# Patient Record
Sex: Female | Born: 1970 | Race: White | Hispanic: No | Marital: Married | Smoking: Never smoker
Health system: Southern US, Community
[De-identification: ages and names within clinical notes are randomized; demographics above are authoritative.]

## PROBLEM LIST (undated history)

## (undated) DIAGNOSIS — K625 Hemorrhage of anus and rectum: Secondary | ICD-10-CM

## (undated) DIAGNOSIS — D219 Benign neoplasm of connective and other soft tissue, unspecified: Secondary | ICD-10-CM

## (undated) DIAGNOSIS — T7840XA Allergy, unspecified, initial encounter: Secondary | ICD-10-CM

## (undated) DIAGNOSIS — C801 Malignant (primary) neoplasm, unspecified: Secondary | ICD-10-CM

## (undated) HISTORY — PX: DILATION AND CURETTAGE OF UTERUS: SHX78

## (undated) HISTORY — PX: DENTAL SURGERY: SHX609

## (undated) HISTORY — DX: Hemorrhage of anus and rectum: K62.5

## (undated) HISTORY — DX: Benign neoplasm of connective and other soft tissue, unspecified: D21.9

## (undated) HISTORY — PX: COLONOSCOPY: SHX174

## (undated) HISTORY — DX: Allergy, unspecified, initial encounter: T78.40XA

## (undated) HISTORY — DX: Malignant (primary) neoplasm, unspecified: C80.1

---

## 2006-09-22 ENCOUNTER — Ambulatory Visit: Payer: Self-pay | Admitting: Internal Medicine

## 2006-09-22 LAB — CONVERTED CEMR LAB
ALT: 19 units/L (ref 0–40)
AST: 23 units/L (ref 0–37)
Albumin: 4.1 g/dL (ref 3.5–5.2)
Alkaline Phosphatase: 43 units/L (ref 39–117)
BUN: 13 mg/dL (ref 6–23)
Basophils Absolute: 0 10*3/uL (ref 0.0–0.1)
Basophils Relative: 0.8 % (ref 0.0–1.0)
Bilirubin, Direct: 0.1 mg/dL (ref 0.0–0.3)
CO2: 26 meq/L (ref 19–32)
Calcium: 9.6 mg/dL (ref 8.4–10.5)
Chloride: 106 meq/L (ref 96–112)
Cholesterol: 176 mg/dL (ref 0–200)
Creatinine, Ser: 0.9 mg/dL (ref 0.4–1.2)
Eosinophils Absolute: 0.2 10*3/uL (ref 0.0–0.6)
Eosinophils Relative: 4.1 % (ref 0.0–5.0)
GFR calc Af Amer: 92 mL/min
GFR calc non Af Amer: 76 mL/min
Glucose, Bld: 94 mg/dL (ref 70–99)
HCT: 40.1 % (ref 36.0–46.0)
HDL: 54.3 mg/dL (ref >39.0)
Hemoglobin: 13.9 g/dL (ref 12.0–15.0)
LDL Cholesterol: 107 mg/dL — ABNORMAL HIGH (ref 0–99)
Lymphocytes Relative: 49.7 % — ABNORMAL HIGH (ref 12.0–46.0)
MCHC: 34.8 g/dL (ref 30.0–36.0)
MCV: 91.6 fL (ref 78.0–100.0)
Monocytes Absolute: 0.4 10*3/uL (ref 0.2–0.7)
Monocytes Relative: 9.3 % (ref 3.0–11.0)
Neutro Abs: 1.7 10*3/uL (ref 1.4–7.7)
Neutrophils Relative %: 36.1 % — ABNORMAL LOW (ref 43.0–77.0)
Platelets: 164 10*3/uL (ref 150–400)
Potassium: 4.4 meq/L (ref 3.5–5.1)
RBC: 4.38 M/uL (ref 3.87–5.11)
RDW: 12.4 % (ref 11.5–14.6)
Sodium: 142 meq/L (ref 135–145)
TSH: 6.17 microintl units/mL — ABNORMAL HIGH (ref 0.35–5.50)
Total Bilirubin: 1 mg/dL (ref 0.3–1.2)
Total CHOL/HDL Ratio: 3.2
Total Protein: 6.9 g/dL (ref 6.0–8.3)
Triglycerides: 72 mg/dL (ref 0–149)
VLDL: 14 mg/dL (ref 0–40)
WBC: 4.6 10*3/uL (ref 4.5–10.5)

## 2006-09-29 ENCOUNTER — Ambulatory Visit: Payer: Self-pay | Admitting: Internal Medicine

## 2006-10-09 ENCOUNTER — Encounter: Admission: RE | Admit: 2006-10-09 | Discharge: 2006-10-09 | Payer: Self-pay | Admitting: Internal Medicine

## 2006-10-25 ENCOUNTER — Encounter: Admission: RE | Admit: 2006-10-25 | Discharge: 2006-10-25 | Payer: Self-pay | Admitting: Internal Medicine

## 2006-12-22 ENCOUNTER — Ambulatory Visit: Payer: Self-pay | Admitting: Internal Medicine

## 2006-12-22 LAB — CONVERTED CEMR LAB: TSH: 3.63 microintl units/mL (ref 0.35–5.50)

## 2006-12-29 ENCOUNTER — Ambulatory Visit: Payer: Self-pay | Admitting: Internal Medicine

## 2007-03-29 DIAGNOSIS — E348 Other specified endocrine disorders: Secondary | ICD-10-CM | POA: Insufficient documentation

## 2007-07-05 ENCOUNTER — Ambulatory Visit: Payer: Self-pay | Admitting: Internal Medicine

## 2007-07-05 DIAGNOSIS — M25569 Pain in unspecified knee: Secondary | ICD-10-CM | POA: Insufficient documentation

## 2007-07-05 DIAGNOSIS — M25519 Pain in unspecified shoulder: Secondary | ICD-10-CM | POA: Insufficient documentation

## 2007-07-18 ENCOUNTER — Telehealth (INDEPENDENT_AMBULATORY_CARE_PROVIDER_SITE_OTHER): Payer: Self-pay | Admitting: *Deleted

## 2007-07-22 ENCOUNTER — Encounter: Admission: RE | Admit: 2007-07-22 | Discharge: 2007-07-22 | Payer: Self-pay | Admitting: Internal Medicine

## 2007-07-25 ENCOUNTER — Telehealth: Payer: Self-pay | Admitting: Internal Medicine

## 2007-10-24 ENCOUNTER — Ambulatory Visit: Payer: Self-pay | Admitting: Internal Medicine

## 2007-10-24 DIAGNOSIS — R634 Abnormal weight loss: Secondary | ICD-10-CM | POA: Insufficient documentation

## 2007-10-31 LAB — CONVERTED CEMR LAB: Tissue Transglutaminase Ab, IgA: 0.6 units (ref <7)

## 2007-11-15 ENCOUNTER — Ambulatory Visit: Payer: Self-pay | Admitting: Gastroenterology

## 2008-01-10 ENCOUNTER — Ambulatory Visit: Payer: Self-pay | Admitting: Internal Medicine

## 2008-01-10 ENCOUNTER — Telehealth: Payer: Self-pay | Admitting: Internal Medicine

## 2008-01-11 ENCOUNTER — Telehealth: Payer: Self-pay | Admitting: Internal Medicine

## 2008-01-24 ENCOUNTER — Telehealth: Payer: Self-pay | Admitting: Internal Medicine

## 2008-01-25 ENCOUNTER — Ambulatory Visit: Payer: Self-pay | Admitting: Internal Medicine

## 2008-01-25 DIAGNOSIS — R0609 Other forms of dyspnea: Secondary | ICD-10-CM

## 2008-01-25 DIAGNOSIS — R0989 Other specified symptoms and signs involving the circulatory and respiratory systems: Secondary | ICD-10-CM | POA: Insufficient documentation

## 2008-02-12 ENCOUNTER — Encounter: Payer: Self-pay | Admitting: Internal Medicine

## 2008-02-12 ENCOUNTER — Ambulatory Visit: Payer: Self-pay | Admitting: Internal Medicine

## 2008-02-18 ENCOUNTER — Telehealth (INDEPENDENT_AMBULATORY_CARE_PROVIDER_SITE_OTHER): Payer: Self-pay | Admitting: *Deleted

## 2008-04-11 ENCOUNTER — Encounter: Payer: Self-pay | Admitting: Gastroenterology

## 2008-04-14 ENCOUNTER — Telehealth: Payer: Self-pay | Admitting: Gastroenterology

## 2008-04-23 ENCOUNTER — Ambulatory Visit: Payer: Self-pay | Admitting: Gastroenterology

## 2008-04-30 ENCOUNTER — Ambulatory Visit: Payer: Self-pay | Admitting: Gastroenterology

## 2008-04-30 ENCOUNTER — Encounter: Payer: Self-pay | Admitting: Gastroenterology

## 2008-05-01 ENCOUNTER — Telehealth: Payer: Self-pay | Admitting: Gastroenterology

## 2008-05-02 ENCOUNTER — Encounter: Payer: Self-pay | Admitting: Gastroenterology

## 2010-01-10 IMAGING — CT CT ABD-PELV W/O CM
3 of 8 series · 13 of 42 positions shown, 19 images · IV contrast (CONTRAST)
Comparison: NONE

CLINICAL DATA: Unexplained weight loss. 

CT OF THE ABDOMEN AND PELVIS WITHOUT AND WITH INTRAVENOUS AND 
FOLLOWING  ORAL CONTRAST
TECHNIQUE: Multiple axial 5-millimeter thick slices at 
5-millimeter intervals were obtained from the lung base through 
the pelvis following the intravenous administration of 100 cc of 
Optiray 350 at a rate of 3 cc per second. Oral contrast was 
administered as well.  Arterial and venous phase imaging was 
obtained in the upper abdomen with delayed images obtained through 
the pelvis.

[Series 4: venous · axial · portal-venous · 0.65mm/px · z∈[+644,+909]mm · 5 of 81 slices shown, 10 images]
[im 14/81  soft-tissue]
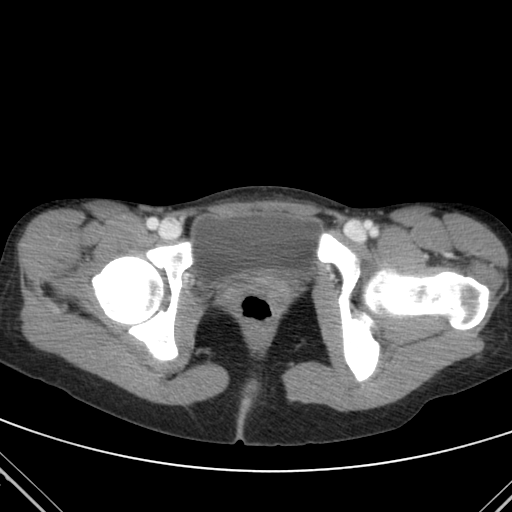
[im 14/81  bone]
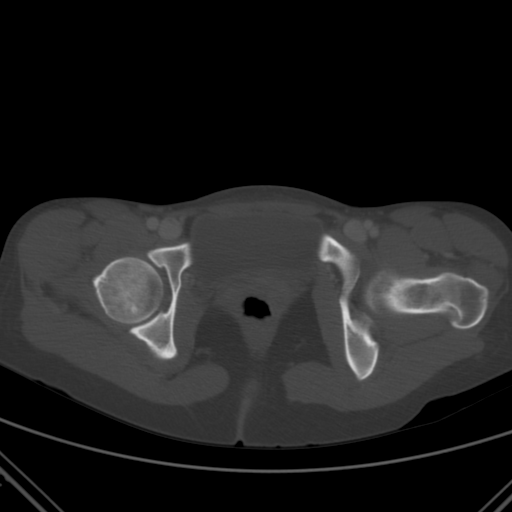
[im 27/81  soft-tissue]
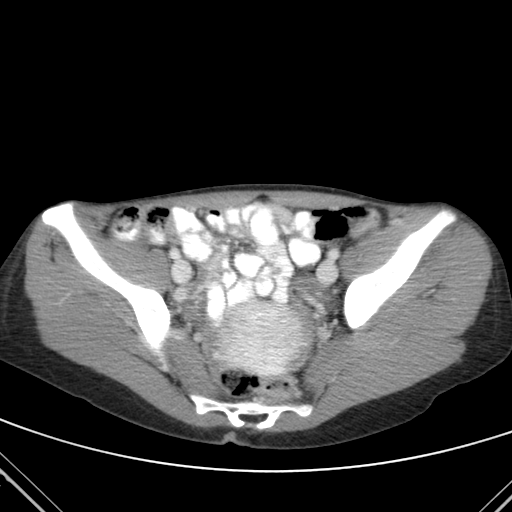
[im 27/81  lung]
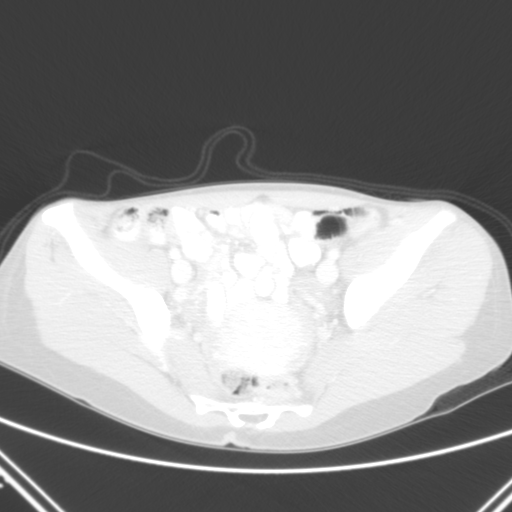
[im 41/81  soft-tissue]
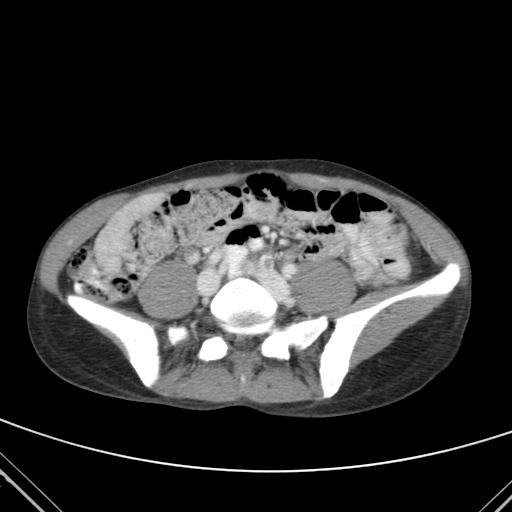
[im 41/81  lung]
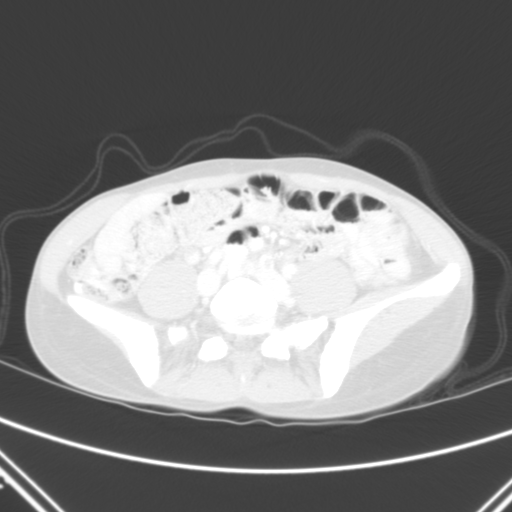
[im 54/81  soft-tissue]
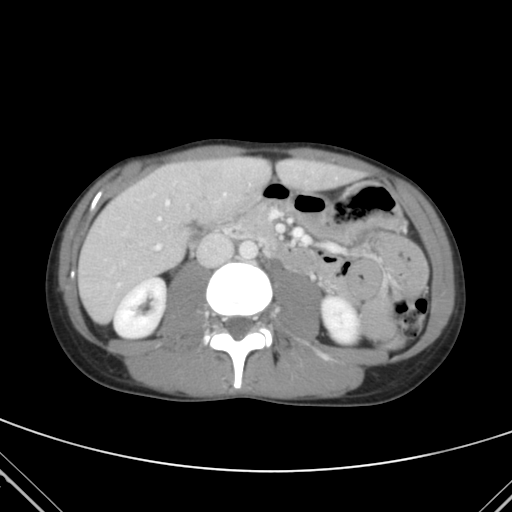
[im 54/81  lung]
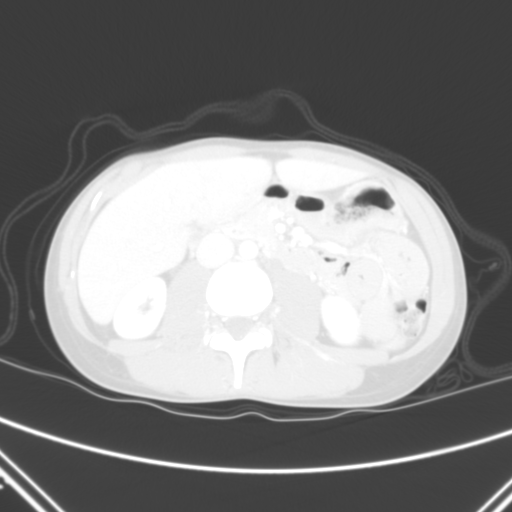
[im 67/81  soft-tissue]
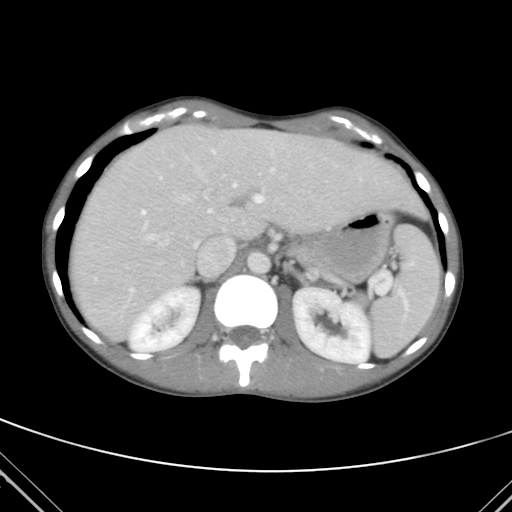
[im 67/81  lung]
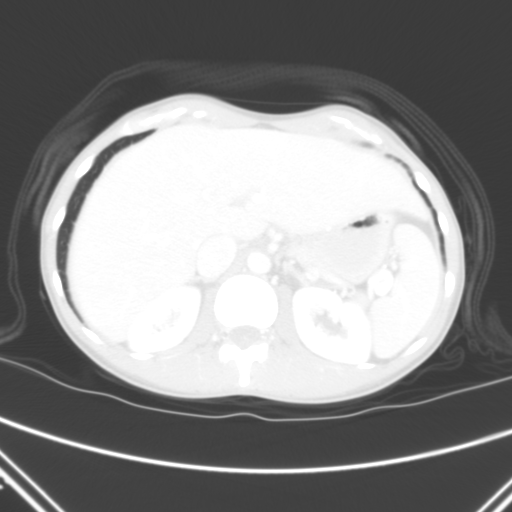

[Series 9: delays · axial · 0.65mm/px · z∈[+639,+909]mm · 5 of 82 slices shown]
[im 14/82  soft-tissue]
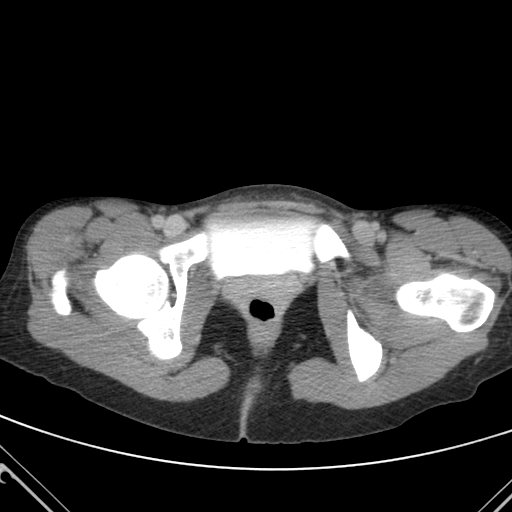
[im 28/82  soft-tissue]
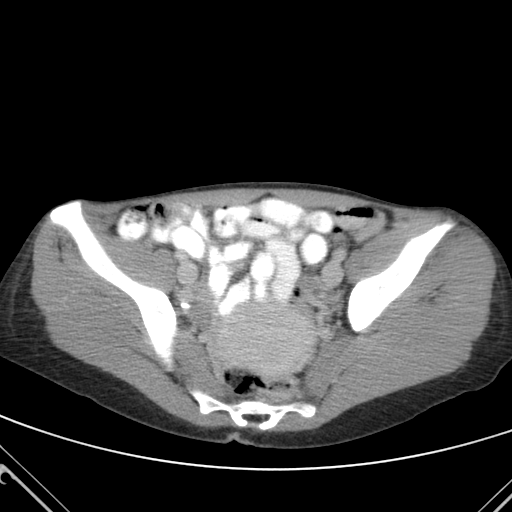
[im 41/82  soft-tissue]
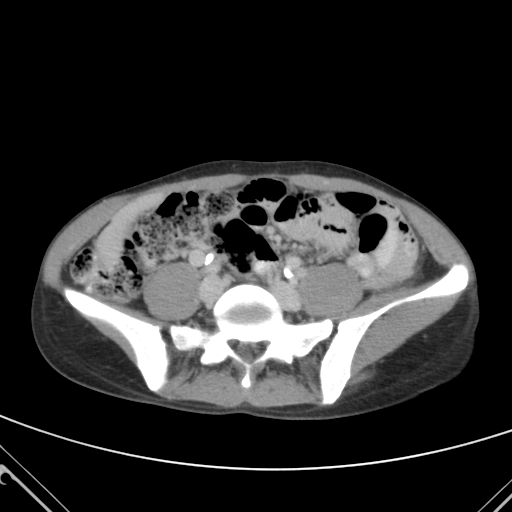
[im 55/82  soft-tissue]
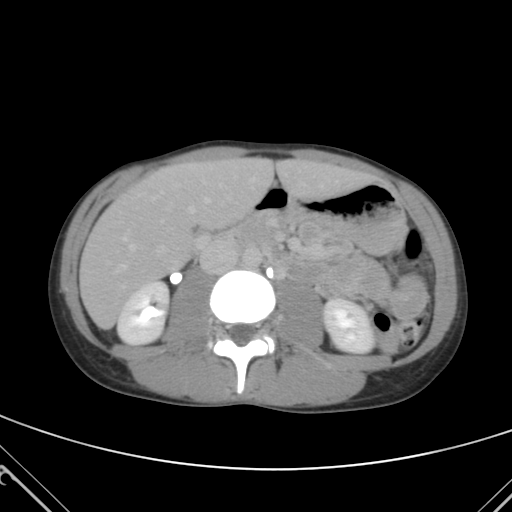
[im 68/82  soft-tissue]
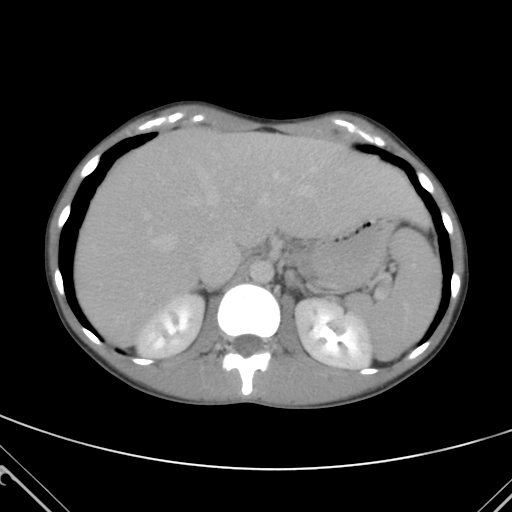

[coronals · coronal · 0.78mm/px · 3 of 59 slices shown, 4 images]
[im 20/59  soft-tissue]
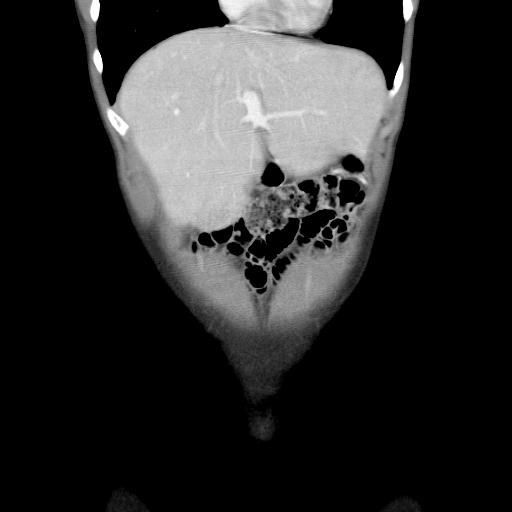
[im 26/59  soft-tissue]
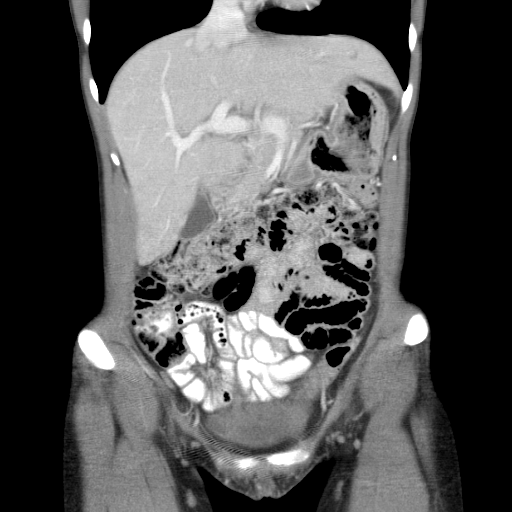
[im 26/59  bone]
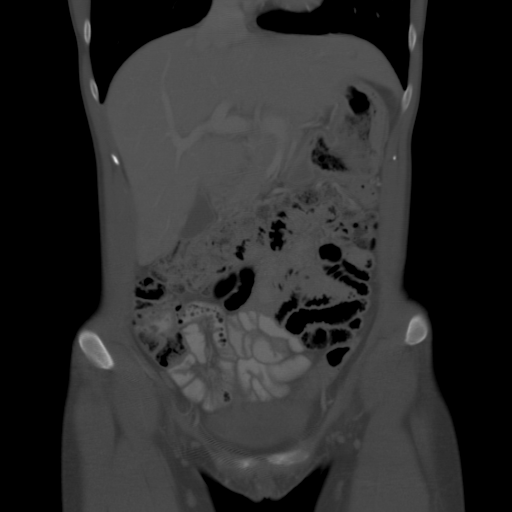
[im 33/59  soft-tissue]
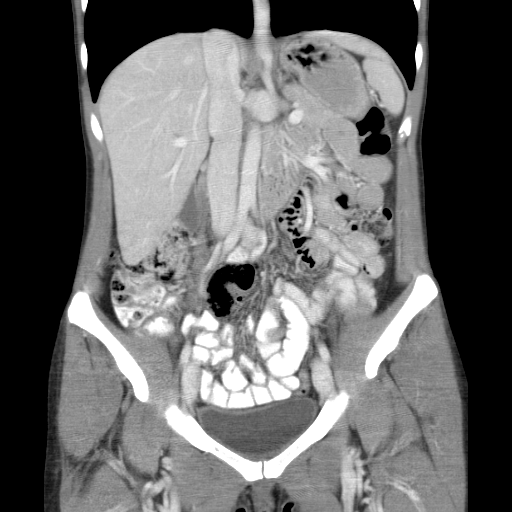

[13 of 42 positions shown; findings below may reference images not displayed]

FINDINGS: No renal calculi are identified. The liver, gallbladder, 
and spleen are unremarkable.  No focal or diffuse enlargement is 
noted of the pancreas.  There is no evidence of upper abdominal or 
retroperitoneal mass, adenopathy, or aneurysm. No adrenal or renal 
masses are noted.  There is no evidence of uropathy. No bowel, 
mesenteric, adenopathy or inflammatory process. No lung base mass, 
infiltrate, edema, or effusion. No lytic or blastic lesions are 
identified.
IMPRESSION: Negative CT of the abdomen. Ravi, Alexei 
electronically reviewed on 04/18/2008 Dict Date: 04/18/2008  Tran 
Date:  04/18/2008 DAS  [REDACTED]

## 2010-09-05 ENCOUNTER — Encounter: Payer: Self-pay | Admitting: Family Medicine

## 2010-12-28 NOTE — Assessment & Plan Note (Signed)
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 Cato Mulligan for evaluation and possible  celiac disease.   Stacy Roberts has had 6 months of gas, bloating, floating stools,  frequent  stools, but no rectal bleeding, anorexia or weight loss.  She denies  abdominal pain.  She has had no major changes in her diet, foreign  travel, or known infectious disease exposure.  Over the last several  weeks her bowels have returned to normal.  She does have periodic bouts  with constipation.  She has had no upper gastrointestinal,  hepatobiliary, or general medical symptoms otherwise.  Specifically, she  denies skin rashes, joint pains, oral stomatitis, visual difficulties.  She does have a chronic history, however, of recurrent unexplained iron  deficiency and mild osteopenia.  She generally feels well, denies fever,  chills, or other systemic complaints.  She has never had endoscopic  exams of her intestines and has not had history of familial  gastrointestinal problems as a child.   PAST MEDICAL HISTORY:  The patient has had a D and C for miscarriage and  has had previous TMJ surgery.   MEDICATIONS:  None.   ALLERGIES:  NONE.   FAMILY HISTORY:  Remarkable for Hashimoto's disease with thyroid cancer  and surgery in her mother.  She also has a paternal grandmother that had  colon cancer at an elderly age.   SOCIAL HISTORY:  She is married, lives with her husband and family and  has a bachelor's degree.  She does not use smoke or use ethanol.   REVIEW OF SYSTEMS:  Noncontributory with last menstrual period 2 weeks  ago.   LABORATORY DATA:  Recent lab data per Dr. Cato Mulligan showed a negative  tissue  transglutaminase and a weakly positive IgG antigliadin antibody  with a normal IgA antigliadin antibody.  Lab data otherwise included a  CBC, a metabolic profile was normal, except for slightly elevated TSH of  6.17.  The patient has had recent MRI of her head and has a small  neopaneal cyst.  Also reviewed on her chart, the patient does take  calcium and vitamin D replacement therapy.   PHYSICAL EXAMINATION:  She is a healthy-appearing white female,  appearing her stated age in no acute distress.  I cannot appreciate  stigmata of chronic liver disease.  She weighs 115 pounds.  Her blood  pressure is 90/64 and pulse was 72 and regular.  There was no  thyromegaly or lymphadenopathy noted.  Her chest was entirely clear and  she was in a regular rhythm without murmurs, gallops or rubs.  ABDOMEN:  Showed no organomegaly, masses, or tenderness.  Bowel sounds  were normal.  EXTREMITIES:  Unremarkable, without edema, phlebitis, swollen joints, or  any rashes.  MENTAL STATUS:  Clear.   ASSESSMENT:  Faige may have gluten  sensitivity, but I doubt she has  genetic celiac disease with a negative tissue transglutaminase.  I  really cannot explain her symptomatology otherwise, except she most  likely has irritable bowel syndrome.   RECOMMENDATIONS:  I have gone ahead and scheduled Stacy Roberts for  endoscopy and small bowel biopsy for definitive diagnosis to exclude  celiac disease.  Her gastrointestinal followup treatment will depend on  these results.  I have asked her to followup otherwise with Dr. Cato Mulligan  as previously planned.     Vania Rea. Jarold Motto, MD, Caleen Essex, FAGA  Electronically Signed    DRP/MedQ  DD: 11/15/2007  DT: 11/15/2007  Job #: [id number]   cc:   Valetta Mole. Swords, MD

## 2011-04-11 ENCOUNTER — Other Ambulatory Visit: Payer: Self-pay | Admitting: Dermatology

## 2012-02-07 ENCOUNTER — Other Ambulatory Visit: Payer: Self-pay | Admitting: Obstetrics and Gynecology

## 2012-02-07 DIAGNOSIS — N63 Unspecified lump in unspecified breast: Secondary | ICD-10-CM

## 2012-02-14 ENCOUNTER — Ambulatory Visit
Admission: RE | Admit: 2012-02-14 | Discharge: 2012-02-14 | Disposition: A | Discharge status: Home / Self Care | Payer: 59 | Source: Ambulatory Visit | Attending: Obstetrics and Gynecology | Admitting: Obstetrics and Gynecology

## 2012-02-14 DIAGNOSIS — N63 Unspecified lump in unspecified breast: Secondary | ICD-10-CM

## 2013-05-22 ENCOUNTER — Other Ambulatory Visit: Payer: Self-pay | Admitting: Obstetrics and Gynecology

## 2013-05-22 DIAGNOSIS — N6002 Solitary cyst of left breast: Secondary | ICD-10-CM

## 2013-06-03 ENCOUNTER — Ambulatory Visit
Admission: RE | Admit: 2013-06-03 | Discharge: 2013-06-03 | Disposition: A | Discharge status: Home / Self Care | Payer: 59 | Source: Ambulatory Visit | Attending: Obstetrics and Gynecology | Admitting: Obstetrics and Gynecology

## 2013-06-03 DIAGNOSIS — N6002 Solitary cyst of left breast: Secondary | ICD-10-CM

## 2013-07-16 ENCOUNTER — Encounter: Payer: Self-pay | Admitting: Family Medicine

## 2013-07-16 ENCOUNTER — Ambulatory Visit (INDEPENDENT_AMBULATORY_CARE_PROVIDER_SITE_OTHER): Payer: 59 | Admitting: Family Medicine

## 2013-07-16 VITALS — BP 114/79 | HR 60 | Ht 66.0 in | Wt 110.0 lb

## 2013-07-16 DIAGNOSIS — M25579 Pain in unspecified ankle and joints of unspecified foot: Secondary | ICD-10-CM

## 2013-07-16 DIAGNOSIS — M25571 Pain in right ankle and joints of right foot: Secondary | ICD-10-CM

## 2013-07-16 NOTE — Patient Instructions (Signed)
You have peroneal tendinitis. I'd recommend resting from running for 7-10 days. Ok to cross train with swimming, cycling, elliptical if pain is only 1-2 on a scale of 1-10. Icing 15 minutes at a time 3-4 times a day. Aleve 2 tabs twice a day with food OR ibuprofen 600mg  three times a day with food for pain and inflammation for next 7-10 days then as needed. Do home exercises (calf raises, external rotation) 3 sets of 10 once a day for next 6 weeks. Arch supports (scaphoid pads or dr. Jari Sportsman active series inserts) in running shoes for sure - consider in other shoes also. When returning to running start with walk: jog 1 minute : 1 minute for total of 10 minutes.  Every other day increase jog time by a minute, total time by 5 minutes (so 1:2 walk: jog for 15 minutes; 1:3 for 20 minutes, etc) until you get back onto your program. Cross train on off days Follow up in 1 month.

## 2013-07-17 ENCOUNTER — Encounter: Payer: Self-pay | Admitting: Family Medicine

## 2013-07-17 DIAGNOSIS — M25571 Pain in right ankle and joints of right foot: Secondary | ICD-10-CM | POA: Insufficient documentation

## 2013-07-17 NOTE — Assessment & Plan Note (Signed)
consistent with peroneal tendinopathy.  Brief ultrasound does not show abnormalities of the cuboid to suggest stress fracture.  She does have target sign of peroneus brevis though and exam fits with tendinopathy here.  Recommended rest from running for 7-10 days.  Icing, nsaids, arch support and home exercise program which was reviewed today.  Discussed return to running program.  F/u in 1 month.

## 2013-07-17 NOTE — Progress Notes (Signed)
Patient ID: Stacy Roberts, female   DOB: 03-30-1971, 42 y.o.   MRN: [id number]  PCP: Judie Petit, MD  Subjective:   HPI: Patient is a 42 y.o. female here for right foot pain.  Patient reports she is doing a couch to 10K program About 1 week ago running 5.5-6 miles outside of right foot started hurting. Ran again on Thursday and couldn't walk afterwards. Pain lateral ankle with swelling. Has been doing compression, icing, taking advil. Not run since then. Has improved to 2/10 level of pain now. No prior h/o stress fracture.  History reviewed. No pertinent past medical history.  No current outpatient prescriptions on file prior to visit.   No current facility-administered medications on file prior to visit.    History reviewed. No pertinent past surgical history.  No Known Allergies  History   Social History  . Marital Status: Married    Spouse Name: N/A    Number of Children: N/A  . Years of Education: N/A   Occupational History  . Not on file.   Social History Main Topics  . Smoking status: Never Smoker   . Smokeless tobacco: Not on file  . Alcohol Use: Not on file  . Drug Use: Not on file  . Sexual Activity: Not on file   Other Topics Concern  . Not on file   Social History Narrative  . No narrative on file    Family History  Problem Relation Age of Onset  . Sudden death Neg Hx   . Hypertension Neg Hx   . Hyperlipidemia Neg Hx   . Heart attack Neg Hx   . Diabetes Neg Hx     BP 114/79  Pulse 60  Ht 5\' 6"  (1.676 m)  Wt 110 lb (49.896 kg)  BMI 17.76 kg/m2  Review of Systems: See HPI above.    Objective:  Physical Exam:  Gen: NAD  R foot/ankle: Mild lateral ankle swelling.  No bruising, other deformity. FROM with pain on lateral rotation, calf raise. TTP peroneal tendons, less over cuboid.  No other TTP foot/ankle. Negative ant drawer and talar tilt.   Negative syndesmotic compression. Thompsons test negative. NV intact distally.    Assessment & Plan:  1. Right ankle pain - consistent with peroneal tendinopathy.  Brief ultrasound does not show abnormalities of the cuboid to suggest stress fracture.  She does have target sign of peroneus brevis though and exam fits with tendinopathy here.  Recommended rest from running for 7-10 days.  Icing, nsaids, arch support and home exercise program which was reviewed today.  Discussed return to running program.  F/u in 1 month.

## 2013-11-28 ENCOUNTER — Other Ambulatory Visit: Payer: Self-pay | Admitting: Obstetrics and Gynecology

## 2013-11-28 DIAGNOSIS — N63 Unspecified lump in unspecified breast: Secondary | ICD-10-CM

## 2013-12-09 ENCOUNTER — Ambulatory Visit
Admission: RE | Admit: 2013-12-09 | Discharge: 2013-12-09 | Disposition: A | Discharge status: Home / Self Care | Payer: 59 | Source: Ambulatory Visit | Attending: Obstetrics and Gynecology | Admitting: Obstetrics and Gynecology

## 2013-12-09 DIAGNOSIS — N63 Unspecified lump in unspecified breast: Secondary | ICD-10-CM

## 2014-04-05 ENCOUNTER — Encounter: Payer: Self-pay | Admitting: Gastroenterology

## 2014-04-14 ENCOUNTER — Other Ambulatory Visit: Payer: Self-pay | Admitting: Obstetrics and Gynecology

## 2014-04-14 DIAGNOSIS — N63 Unspecified lump in unspecified breast: Secondary | ICD-10-CM

## 2014-04-22 ENCOUNTER — Encounter (INDEPENDENT_AMBULATORY_CARE_PROVIDER_SITE_OTHER): Payer: Self-pay

## 2014-04-22 ENCOUNTER — Other Ambulatory Visit: Payer: 59

## 2014-04-22 ENCOUNTER — Ambulatory Visit
Admission: RE | Admit: 2014-04-22 | Discharge: 2014-04-22 | Disposition: A | Discharge status: Home / Self Care | Payer: 59 | Source: Ambulatory Visit | Attending: Obstetrics and Gynecology | Admitting: Obstetrics and Gynecology

## 2014-04-22 DIAGNOSIS — N63 Unspecified lump in unspecified breast: Secondary | ICD-10-CM

## 2015-06-18 ENCOUNTER — Ambulatory Visit (INDEPENDENT_AMBULATORY_CARE_PROVIDER_SITE_OTHER): Payer: 59 | Admitting: Sports Medicine

## 2015-06-18 ENCOUNTER — Encounter: Payer: Self-pay | Admitting: Sports Medicine

## 2015-06-18 VITALS — BP 115/75 | Ht 67.0 in | Wt 112.0 lb

## 2015-06-18 DIAGNOSIS — S73191A Other sprain of right hip, initial encounter: Secondary | ICD-10-CM | POA: Diagnosis not present

## 2015-06-18 DIAGNOSIS — M67879 Other specified disorders of synovium and tendon, unspecified ankle and foot: Secondary | ICD-10-CM

## 2015-06-18 DIAGNOSIS — S76019A Strain of muscle, fascia and tendon of unspecified hip, initial encounter: Secondary | ICD-10-CM | POA: Insufficient documentation

## 2015-06-18 DIAGNOSIS — R269 Unspecified abnormalities of gait and mobility: Secondary | ICD-10-CM | POA: Diagnosis not present

## 2015-06-18 DIAGNOSIS — M7660 Achilles tendinitis, unspecified leg: Secondary | ICD-10-CM | POA: Insufficient documentation

## 2015-06-18 NOTE — Assessment & Plan Note (Signed)
She needs temporary orthotic correction and possibly conversion to custom orthotics  See office visit

## 2015-06-18 NOTE — Progress Notes (Signed)
Subjective:    Patient ID: Stacy Roberts, female    DOB: 1971-02-12, 44 y.o.   MRN: [id number]  HPI  Shamira Toutant is a 44 year old female with an unremarkable past medical history presenting as a new patient to sports medicine for bilateral heel pain and RIGHT anterior hip pain.  She previously had no history of running but reports that in November 2014 she decided to start running. Originally wearing minimalist shoes, followed by 0 drop shoes, the patient completed 2 rounds of the couch to 5K program. After approximately 6 weeks (first time through the program) she developed bilateral Achilles pain which has been there since that time. She has progressively built up to 20 miles/week since completing her second round of the couch to 5K program. Her ultimate goal is to run a half marathon with a close friend in March 2017.  She reports that her bilateral Achilles pain typically comes on within a half mile or quarter mile of running. It will persist throughout the remainder of her run and she made the appointment today because now it is starting to persist after running and occasionally in the morning with her first few steps. She denies any anterior/plantar heel pain. She denies any other pain in the foot or ankle.   The only self treatment she has tried is purchasing an over-the-counter arch support/heel lift. She has not tried any medication.  Regarding her hip, starting 3 weeks ago she developed a cramping pain/sharp pain with running in the anterior/lateral hip. This is most severe with moving from a seated to standing position, running hills or stairs and after long runs/workouts.  Past medical history, past surgical history, medications, allergies and social history: -All reviewed and updated in EMR   Review of Systems A 10 point review of systems was negative other than documented above     Objective:   Physical Exam Gen: The patient is resting comfortably and in no acute  distress BP 115/75 mmHg  Ht 5\' 7"  (1.702 m)  Wt 112 lb (50.803 kg)  BMI 17.54 kg/m2  Pulmonary: Nonlabored respirations;  Psych: No abnormal thought content; appropriate mood/affect  MUSCULOSKELETAL  Lower extremity evaluation: -No abnormalities on inspection; with supine examination the patient has a 1 cm longer RIGHT leg that does not correct with a transition to a seated position or after resetting the pelvis -She has a mild genu varus angulation bilaterally -4/5 strength in bilateral hip abductor is an adductor's, slightly weaker on the RIGHT -5/5 strength in hip and knee flexion and extension -5/5 strength in hip external rotation/internal rotation  -Negative rock test; negative SI compression test. The pelvis is highly mobile. -Nontender to palpation of the AIIS, ASIS, SI joints and iliac crest bilaterally  FOOT exam -Weightbearing foot exam reveals a long, thin foot -There is some partial/metatarsal prominence/bulging in the bilateral forefeet -There is mild collapse of the bilateral transverse arches with preservation longitudinal arches -Mild valgus angulation of the bilateral Achilles tendons; symmetric diameter and appearance.  BILATERAL ACHILLES TENDON MUSCULOSKELETAL SONOGRAPHY: -The patient demonstrated bilateral retrocalcaneal bursitis, likely evidence of chronic Achilles strain/ compression -This results in increased Doppler flow -The patient had relatively small Achilles tendons bilaterally; demonstrating a longitudinal AP diameter of 0.35 and 0.41 centimeter respectively on the right and left  - transverse diameters of 1.05 and 0.95 cm respectively on the right and left.   GAIT ANALYSIS: -Evaluation of the patient's running shoes revealed her over-the-counter heel cups/arch support or in the wrong foot, respectively -  Plantar aspect of the shoes reveal excessive wear on the lateral portion and heel -The patient has significant toeing out/external rotation of the  hip with jogging - There is significant genu valgum of bilateral knees with angulation and an outward whip of RIGHT leg -Pronation bilaterally, right worse left  PROCEDURE: Patient was fitted for a standard GREEN temporary orthotic.   Size: 8 base: GREEN temporary posting: scaphoid pads bilat additional orthotic padding: Bilateral heel lifts   POST ORTHOTIC GAIT -Almost complete resolution of genu valgum deformity with running -Mild but significantly reduced outward whip of the right leg -Resolution of previously demonstrated pronation        Assessment & Plan:   #1. Bilateral Achilles pain Patient with bilateral Achilles pain now 2 years, most likely as a consequence of her biomechanical baseline, detailed above and then further exacerbated by the use of minimalist shoes, followed by the use of 0 drop shoes.   # 2 Gait abnormality: The patient had remarkable genu valgum deformity with jogging and significant toeing out and pronation, all of which would explain significant strain placed on the bilateral Achilles tendons. These were greatly improved or resolved respectively with the addition of temporary orthotics.  -Alfredson exercises for eccentric Achilles tendon rehabilitation -Temporary orthotics within shoes with all running -No more running in minimalist shoes or 0 drop shoes; recommend at least 5 drop   #3. TFL strain, right Patient was physical exam findings most concerning for a RIGHT-sided tensor fascia lata strain. Most likely cause was mechanical given that this is her long leg and her poor running mechanics detailed above in addition to increasing mileage recently. Also has increased hill work.  -Provided with handout and instructions for bilateral hip abductor and adductor program -Also provided with hip external rotation maneuvers for further core strengthening -Follow-up in 4-6 weeks for this and other problem as detailed above   Spent a total of 35 minutes  face-to-face with this patient evaluating her Achilles pain hip pain and gait abnormality. More than 50% of this was spent in direct counseling.

## 2015-06-18 NOTE — Assessment & Plan Note (Signed)
See office visit

## 2015-06-18 NOTE — Assessment & Plan Note (Signed)
Home rehabilitation program-see office visit

## 2015-08-05 ENCOUNTER — Ambulatory Visit: Payer: 59 | Admitting: Family Medicine

## 2016-03-08 NOTE — Progress Notes (Signed)
[facility] [place], [place] [address] Phone: [phone] Subjective:     CC: numbness down right arm  [id number]  Stacy Roberts is a 45 y.o. female coming in with complaint of intermittent numbness right arm that has become constant. Patient states in 2006 she did have some pain in her neck and did get an MRI at that time. Patient by report was independently visualized by me. 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 Patient states that it seemed to go away on its own. Patient states that this time and seems to be worsening. Now it is constant numbness. No weakness. States that pain in the neck seems to be more intermittent. Patient has not taken any medications. Sign of the provider and is scheduled for an MRI. Patient is wondering if there is anything else that could be contributing to this pain because she would like him not to be her neck. Raises severity of pain as 5 out of 10 at all times and sometimes 7 out of 10. Or concerning about the constant numbness now in the middle finger.does not remember any true injury      No past medical history on file. No past surgical history on file. Social History   Social History  . Marital status: Married    Spouse name: N/A  . Number of children: N/A  . Years of education: N/A   Social History Main Topics  . Smoking status: Never Smoker  . Smokeless tobacco: None  . Alcohol use None  . Drug use: Unknown  . Sexual activity: Not Asked   Other Topics Concern  . None   Social History Narrative  . None   No Known Allergies Family History  Problem Relation Age of Onset  . Sudden death Neg Hx   . Hypertension Neg Hx   . Hyperlipidemia Neg Hx   . Heart attack Neg Hx   . Diabetes Neg Hx     Past medical history, social, surgical and family history all reviewed in electronic medical record.  No pertanent information unless stated regarding to the chief  complaint.   Review of Systems: No headache, visual changes, nausea, vomiting, diarrhea, constipation, dizziness, abdominal pain, skin rash, fevers, chills, night sweats, weight loss, swollen lymph nodes, body aches, joint swelling, muscle aches, chest pain, shortness of breath, mood changes.   Objective  Blood pressure 116/76, pulse 84, weight 114 lb (51.7 kg).  General: No apparent distress alert and oriented x3 mood and affect normal, dressed appropriately.  HEENT: Pupils equal, extraocular movements intact  Respiratory: Patient's speak in full sentences and does not appear short of breath  Cardiovascular: No lower extremity edema, non tender, no erythema  Skin: Warm dry intact with no signs of infection or rash on extremities or on axial skeleton.  Abdomen: Soft nontender  Neuro: Cranial nerves II through XII are intact, neurovascularly intact in all extremities with 2+ DTRs and 2+ pulses.  Lymph: No lymphadenopathy of posterior or anterior cervical chain or axillae bilaterally.  Gait normal with good balance and coordination.  MSK:  Non tender with full range of motion and good stability and symmetric strength and tone of shoulders, elbows, wrist, hip, knee and ankles bilaterally. Benign hypermobility syndrome of multiple joints. Beighton score 7-8 Neck: Inspection unremarkable. No palpable stepoffs. Positive Spurling's in the C7 distribution.right-sided Lacks last 5 of extension Grip strength and sensation normal in bilateral hands Strength good C4 to T1  distribution No sensory change to C4 to T1 Negative Hoffman sign bilaterally Reflexes normal   Impression and Recommendations:     This case required medical decision making of moderate complexity.      Note: This dictation was prepared with Dragon dictation along with smaller phrase technology. Any transcriptional errors that result from this process are unintentional.

## 2016-03-09 ENCOUNTER — Encounter: Payer: Self-pay | Admitting: Family Medicine

## 2016-03-09 ENCOUNTER — Ambulatory Visit (INDEPENDENT_AMBULATORY_CARE_PROVIDER_SITE_OTHER): Payer: 59 | Admitting: Family Medicine

## 2016-03-09 DIAGNOSIS — M5010 Cervical disc disorder with radiculopathy, unspecified cervical region: Secondary | ICD-10-CM | POA: Diagnosis not present

## 2016-03-09 DIAGNOSIS — M357 Hypermobility syndrome: Secondary | ICD-10-CM

## 2016-03-09 DIAGNOSIS — Q796 Ehlers-Danlos syndrome: Secondary | ICD-10-CM

## 2016-03-09 MED ORDER — PREDNISONE 50 MG PO TABS: 50.0000 mg | ORAL_TABLET | Freq: Every day | ORAL | 0 refills | Status: DC

## 2016-03-09 MED ORDER — GABAPENTIN 100 MG PO CAPS: 200.0000 mg | ORAL_CAPSULE | Freq: Every day | ORAL | 3 refills | Status: DC

## 2016-03-09 NOTE — Patient Instructions (Addendum)
God to see you  Ice your friend Ice 20 minutes 2 times daily. Usually after activity and before bed. Prednisone daily 5 days  Gabapentin 100-200mg  at night to help with the pain in the hand and numbness.  Vitamin D 2000 Iu daily  Exercises 3 times a week.  No lifting above head or with hands outside your peripheral vision.  See me again in 7-10 days and we will make sure you are doing well and will consider physicla therapy.

## 2016-03-09 NOTE — Assessment & Plan Note (Signed)
Patient has had a history of bulging disc previously and patient's symptoms do correspond with the skin. Discussed with patient and do prednisone and gabapentin. We discussed icing regimen. Patient and will come back and see me again in 7-10 days. If responding well we'll consider patient going to formal physical therapy.if worsening symptoms or weakness patient would need to advance imaging.

## 2016-03-21 NOTE — Progress Notes (Signed)
[facility] [address] [address] [place], [place] [address] Phone: [phone] Subjective:     CC: numbness down right arm f/u   [id number]  Stacy Roberts is a 45 y.o. female coming in with complaint of intermittent numbness right arm that has become constant. Patient states in 2006 she did have some pain in her neck and did get an MRI at that time. Patient by report was independently visualized by me. 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  Patient was seen before. Did not take any medication. Trying to do home exercises with no significant improvement. Patient actually states that the numbness and tingling seems to be getting worse. Patient states now now the anterior chest wall on the right side also has more of a tight sensation when it occurs. Patient denies any new changes otherwise. Continues to try to stay active but unfortunately is having more difficulty secondary to the numbness. Wouldn't state that maybe the pain has gotten better overall but is concerned because she has noticed that she has been a little less strength in her hand.       No past medical history on file. No past surgical history on file. Social History   Social History  . Marital status: Married    Spouse name: N/A  . Number of children: N/A  . Years of education: N/A   Social History Main Topics  . Smoking status: Never Smoker  . Smokeless tobacco: None  . Alcohol use None  . Drug use: Unknown  . Sexual activity: Not Asked   Other Topics Concern  . None   Social History Narrative  . None   No Known Allergies Family History  Problem Relation Age of Onset  . Sudden death Neg Hx   . Hypertension Neg Hx   . Hyperlipidemia Neg Hx   . Heart attack Neg Hx   . Diabetes Neg Hx     Past medical history, social, surgical and family history all reviewed in electronic medical record.  No pertanent information unless stated regarding to the chief  complaint.   Review of Systems: No headache, visual changes, nausea, vomiting, diarrhea, constipation, dizziness, abdominal pain, skin rash, fevers, chills, night sweats, weight loss, swollen lymph nodes, body aches, joint swelling, muscle aches, chest pain, shortness of breath, mood changes.   Objective  Blood pressure 118/78, pulse 68, height 5\' 7"  (1.702 m), weight 115 lb (52.2 kg), SpO2 97 %.  General: No apparent distress alert and oriented x3 mood and affect normal, dressed appropriately.  HEENT: Pupils equal, extraocular movements intact  Respiratory: Patient's speak in full sentences and does not appear short of breath  Cardiovascular: No lower extremity edema, non tender, no erythema  Skin: Warm dry intact with no signs of infection or rash on extremities or on axial skeleton.  Abdomen: Soft nontender  Neuro: Cranial nerves II through XII are intact, neurovascularly intact in all extremities with 2+ DTRs and 2+ pulses.  Lymph: No lymphadenopathy of posterior or anterior cervical chain or axillae bilaterally.  Gait normal with good balance and coordination.  MSK:  Non tender with full range of motion and good stability and symmetric strength and tone of shoulders, elbows, wrist, hip, knee and ankles bilaterally. Benign hypermobility syndrome of multiple joints. Beighton score 7-8 Neck: Inspection unremarkable. No palpable stepoffs. Positive Spurling's in the C7 distribution.right-sided Lacks last 8 of extension  Grip strength and sensation normal in bilateral hands Strength good C4  to T1 distribution Patient though does have pain as well with compression of the anterior chest wall that reproduces some of the radiation down the arm. No sensory change to C4 to T1 Negative Hoffman sign bilaterally Reflexes normal   Impression and Recommendations:     This case required medical decision making of moderate complexity.      Note: This dictation was prepared with Dragon  dictation along with smaller phrase technology. Any transcriptional errors that result from this process are unintentional.

## 2016-03-22 ENCOUNTER — Ambulatory Visit (INDEPENDENT_AMBULATORY_CARE_PROVIDER_SITE_OTHER)
Admission: RE | Admit: 2016-03-22 | Discharge: 2016-03-22 | Disposition: A | Discharge status: Home / Self Care | Payer: 59 | Source: Ambulatory Visit | Attending: Family Medicine | Admitting: Family Medicine

## 2016-03-22 ENCOUNTER — Ambulatory Visit (INDEPENDENT_AMBULATORY_CARE_PROVIDER_SITE_OTHER): Payer: 59 | Admitting: Family Medicine

## 2016-03-22 ENCOUNTER — Encounter: Payer: Self-pay | Admitting: Family Medicine

## 2016-03-22 VITALS — BP 118/78 | HR 68 | Ht 67.0 in | Wt 115.0 lb

## 2016-03-22 DIAGNOSIS — M5010 Cervical disc disorder with radiculopathy, unspecified cervical region: Secondary | ICD-10-CM

## 2016-03-22 MED ORDER — GABAPENTIN 300 MG PO CAPS
ORAL_CAPSULE | ORAL | 3 refills | Status: DC
Start: 2016-03-22 — End: 2018-11-28

## 2016-03-22 NOTE — Patient Instructions (Addendum)
God to see you  Alvera Singh is your friend We will get you in with physical therapy they will call you Gabapentin 300mg  at night We will consider EMG/MRI/labs or something. I want to se eyou again in 3-4 weeks.

## 2016-03-22 NOTE — Assessment & Plan Note (Signed)
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 Patient has had a history of a protruding disc at C7 previously as well. We discussed with patient at great length. New x-rays ordered today. Differential includes a thoracic outlet syndrome. We'll be referred to formal physical therapy. Started on gabapentin to see if this will help with some of the radicular symptoms. We discussed that this does not seem to help patient would need new MRI or EMG if this continues.  Spent  25 minutes with patient face-to-face and had greater than 50% of counseling including as described above in assessment and plan.

## 2016-03-28 ENCOUNTER — Encounter: Payer: Self-pay | Admitting: Family Medicine

## 2016-04-11 NOTE — Progress Notes (Signed)
[facility] [place], [place] [address] Phone: [phone] Subjective:     CC: numbness down right arm f/u   [id number]  Stacy Roberts is a 45 y.o. female coming in with complaint of intermittent numbness right arm that has become constant. Patient states in 2006 she did have some pain in her neck and did get an MRI at that time. Patient by report was independently visualized by me. 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  Patient at last visit was being noncompliant. Encourage her to take the gabapentin on a regular basis, home exercises and icing. Patient states   Unfortunately and has not made any significant improvement. Continues to have the intermittent numbness of the hand. Denies any weakness. Patient feels that she has not made any significant progress symptoms this is starting to affect daily activities. Unable to work out on a regular basis secondary to this pain and discomfort.      No past medical history on file. No past surgical history on file. Social History   Social History  . Marital status: Married    Spouse name: N/A  . Number of children: N/A  . Years of education: N/A   Social History Main Topics  . Smoking status: Never Smoker  . Smokeless tobacco: Not on file  . Alcohol use Not on file  . Drug use: Unknown  . Sexual activity: Not on file   Other Topics Concern  . Not on file   Social History Narrative  . No narrative on file   No Known Allergies Family History  Problem Relation Age of Onset  . Sudden death Neg Hx   . Hypertension Neg Hx   . Hyperlipidemia Neg Hx   . Heart attack Neg Hx   . Diabetes Neg Hx     Past medical history, social, surgical and family history all reviewed in electronic medical record.  No pertanent information unless stated regarding to the chief complaint.   Review of Systems: No headache, visual changes, nausea, vomiting, diarrhea,  constipation, dizziness, abdominal pain, skin rash, fevers, chills, night sweats, weight loss, swollen lymph nodes, body aches, joint swelling, muscle aches, chest pain, shortness of breath, mood changes.   Objective  There were no vitals taken for this visit.  General: No apparent distress alert and oriented x3 mood and affect normal, dressed appropriately.  HEENT: Pupils equal, extraocular movements intact  Respiratory: Patient's speak in full sentences and does not appear short of breath  Cardiovascular: No lower extremity edema, non tender, no erythema  Skin: Warm dry intact with no signs of infection or rash on extremities or on axial skeleton.  Abdomen: Soft nontender  Neuro: Cranial nerves II through XII are intact, neurovascularly intact in all extremities with 2+ DTRs and 2+ pulses.  Lymph: No lymphadenopathy of posterior or anterior cervical chain or axillae bilaterally.  Gait normal with good balance and coordination.  MSK:  Non tender with full range of motion and good stability and symmetric strength and tone of shoulders, elbows, wrist, hip, knee and ankles bilaterally. Benign hypermobility syndrome of multiple joints. Beighton score 7 Neck: Inspection unremarkable. No palpable stepoffs. Positive Spurling's in the C7 distribution.right-sided Lacks last 10 of extension  Grip strength and sensation normal in bilateral hands Strength good C4 to T1 distribution Patient though does have pain as well with compression of the anterior chest wall that reproduces some of the radiation down the arm. No  sensory change to C4 to T1 Negative Hoffman sign bilaterally Reflexes normal No change from previous exam Possibly even worsening with lack of motion.   Impression and Recommendations:     This case required medical decision making of moderate complexity.      Note: This dictation was prepared with Dragon dictation along with smaller phrase technology. Any transcriptional errors  that result from this process are unintentional.

## 2016-04-12 ENCOUNTER — Ambulatory Visit (INDEPENDENT_AMBULATORY_CARE_PROVIDER_SITE_OTHER): Payer: 59 | Admitting: Family Medicine

## 2016-04-12 ENCOUNTER — Encounter: Payer: Self-pay | Admitting: Family Medicine

## 2016-04-12 VITALS — BP 104/74 | HR 68 | Wt 115.0 lb

## 2016-04-12 DIAGNOSIS — M5010 Cervical disc disorder with radiculopathy, unspecified cervical region: Secondary | ICD-10-CM

## 2016-04-12 MED ORDER — DIAZEPAM 5 MG PO TABS: ORAL_TABLET | ORAL | 0 refills | Status: DC

## 2016-04-12 MED ORDER — MELOXICAM 15 MG PO TABS: 15.0000 mg | ORAL_TABLET | Freq: Every day | ORAL | 0 refills | Status: DC

## 2016-04-12 NOTE — Patient Instructions (Signed)
Good to see you  We will get MRI of your neck.  Ice is your friend Gabapentin 200-300mg  nightly, up to you Melxicam daily for 3 days then as needed.  I will write you after the MRI and discuss the possibility of injection.

## 2016-04-12 NOTE — Assessment & Plan Note (Signed)
Patient unfortunate seems to be getting worse. Patient is having more intermittent radicular symptoms. No weakness noted today. I do believe that this is contributing to her discomfort. Patient's imaging has shown possible C7-T1 pathology previously that is consistent with the radicular symptoms. Patient will have an MRI of the neck. Depending on what the MRI shows she could be a candidate for epidural steroid injections. Has failed all other conservative therapy including formal physical therapy will locate home exercises going.  Spent  25 minutes with patient face-to-face and had greater than 50% of counseling including as described above in assessment and plan.

## 2016-04-23 ENCOUNTER — Inpatient Hospital Stay: Admission: RE | Admit: 2016-04-23 | Payer: 59 | Source: Ambulatory Visit

## 2016-07-11 ENCOUNTER — Ambulatory Visit (INDEPENDENT_AMBULATORY_CARE_PROVIDER_SITE_OTHER): Payer: 59 | Admitting: Sports Medicine

## 2016-07-11 ENCOUNTER — Encounter: Payer: Self-pay | Admitting: Sports Medicine

## 2016-07-11 VITALS — BP 136/86 | HR 71 | Ht 67.5 in | Wt 115.0 lb

## 2016-07-11 DIAGNOSIS — M25561 Pain in right knee: Secondary | ICD-10-CM | POA: Diagnosis not present

## 2016-07-11 NOTE — Progress Notes (Signed)
  Stacy Roberts - 45 y.o. female MRN [id number]  Date of birth: [birth year]  SUBJECTIVE:  Including CC & ROS.  Chief complaint: Right knee pain  Stacy Roberts is a 44yo female presenting with intermittent, right lateral knee pain onset 1 month ago. Pt reports that she was running in a race when she had exacerbation of left IT syndrome causing her to compensate with her right leg to finish the race. She describes the right knee pain as a tightness that radiates to her posterior knee and sometimes into her calf muscles. Symptoms worsen as she increases her running distance. She has intermittent popping sensation at right knee when changing her direction of stance. Pt reports that she has tried Meloxicam, ice, and intermittently has worn shoe orthotics while running without relief.  ROS: No erythema No effusion No weakness No falls or trauma to knee  HISTORY: Past Medical, Surgical, Social, and Family History Reviewed & Updated per EMR.   Pertinent Historical Findings include: PMSHx -  Cervical radiculopathy  PSHx - denies tobacco usage  Medications - meloxicam, gabapentin, turmeric    PHYSICAL EXAM:  VS: BP:136/86  HR:71bpm  TEMP: ( )  RESP:   HT:5' 7.5" (171.5 cm)   WT:115 lb (52.2 kg)  BMI:17.8 PHYSICAL EXAM: Gen: NAD, alert, cooperative with exam, well-appearing  Psych:  alert and oriented Right Knee: Normal to inspection with no erythema or effusion or obvious bony abnormalities. There is tenderness to palpation along the right distal IT band at the insertion onto the fibula. Also some tenderness to palpation along the proximal medial head of the gastrocnemius. ROM full in flexion and extension and lower leg rotation. Ligaments with solid consistent endpoints including ACL, PCL, LCL, MCL. Non painful patellar compression. Negative Thessaly's. Slight tenderness to palpation along the lateral joint line. Hamstring and quadriceps strength is normal.   Mild hip abductor  weakness Left Knee: Normal to inspection with no erythema or effusion or obvious bony abnormalities. Palpation normal with no warmth, joint line tenderness, patellar tenderness, or condyle tenderness. ROM full in flexion and extension and lower leg rotation. Ligaments with solid consistent endpoints including ACL, PCL, LCL, MCL. Non painful patellar compression. Hamstring and quadriceps strength is normal. Strong hip abductors  Gait: Genu varus with dynamic genu valgum while running (although overall, she has excellent running form which is a great improvement when compared to Dr. fields previous office note). She does have reproducible pain with running along the lateral knee.  ASSESSMENT & PLAN:   IT band syndrome: -Recommend use of roller to relieve symptoms  -Can use topical anti-inflammatory as needed for pain relief -Continue at home strengthening exercises but will increase repetitions and resistance on the right -Can continue to run  -Follow-up in 4 weeks or sooner if needed  Patient seen and evaluated with medical student. I agree with the above plan of care. Patient seems to present today with distal IT band syndrome of the right knee. Although she does have fairly decent hip abductor strength I would like for her to focus on increasing her repetitions and resistance with her home exercises in regards to her right hip strengthening. Her green inserts are in good condition and do not need to be replaced. She will return to the office in 4 weeks for reevaluation. If her pain persists, then consider diagnostic imaging at that time. In the meantime, she may continue to run using pain as her guide.

## 2016-12-01 ENCOUNTER — Ambulatory Visit (INDEPENDENT_AMBULATORY_CARE_PROVIDER_SITE_OTHER): Payer: 59 | Admitting: Family Medicine

## 2016-12-01 ENCOUNTER — Encounter: Payer: Self-pay | Admitting: Family Medicine

## 2016-12-01 DIAGNOSIS — M5010 Cervical disc disorder with radiculopathy, unspecified cervical region: Secondary | ICD-10-CM

## 2016-12-01 MED ORDER — MELOXICAM 15 MG PO TABS: 15.0000 mg | ORAL_TABLET | Freq: Every day | ORAL | 2 refills | Status: DC

## 2016-12-01 MED ORDER — PREDNISONE 10 MG PO TABS: ORAL_TABLET | ORAL | 0 refills | Status: DC

## 2016-12-01 NOTE — Progress Notes (Signed)
  PCP: No PCP Per Patient  Subjective:   HPI: Patient is a 45 y.o. female here for neck pain.  Patient reports she started having problems with her neck about 12 years ago. Problems have primarily been on the right side though with known C6-7 nerve root impingement leading to numbness. In past she has improved with prednisone dose packs. Has consulted with neurosurgery and had discussion re: injections, operative intervention but improved with conservative measures. Current issue started 3 weeks ago - getting numbness, tingling especially in tip of left index finger. Goes up arm and includes the thumb as well. Pain is 0/10. Has some spasms left side of neck and noticed some weakness trying to put seatbelt on when using left arm. No bowel/bladder dysfunction. No skin changes. No new injuries.  No past medical history on file.  Current Outpatient Prescriptions on File Prior to Visit  Medication Sig Dispense Refill  . Cyanocobalamin (RA VITAMIN B-12 TR) 1000 MCG TBCR Take by mouth.    . gabapentin (NEURONTIN) 300 MG capsule nightly 30 capsule 3  . Turmeric Curcumin 500 MG CAPS Take by mouth.     No current facility-administered medications on file prior to visit.     No past surgical history on file.  No Known Allergies  Social History   Social History  . Marital status: Married    Spouse name: N/A  . Number of children: N/A  . Years of education: N/A   Occupational History  . Not on file.   Social History Main Topics  . Smoking status: Never Smoker  . Smokeless tobacco: Never Used  . Alcohol use Not on file  . Drug use: Unknown  . Sexual activity: Not on file   Other Topics Concern  . Not on file   Social History Narrative  . No narrative on file    Family History  Problem Relation Age of Onset  . Sudden death Neg Hx   . Hypertension Neg Hx   . Hyperlipidemia Neg Hx   . Heart attack Neg Hx   . Diabetes Neg Hx     BP 117/83   Pulse 62   Ht 5\' 7"   (1.702 m)   Wt 112 lb (50.8 kg)   BMI 17.54 kg/m   Review of Systems: See HPI above.     Objective:  Physical Exam:  Gen: NAD, comfortable in exam room  Neck: No gross deformity, swelling, bruising. TTP mildly left paraspinal cervical region.  No midline/bony TTP. FROM neck - radiation down left arm with extension. BUE strength 5/5 except 4/5 with left elbow extension.   Sensation diminished in index finger on left. 2+ equal reflexes in triceps, biceps, brachioradialis tendons. Positive spurlings on left.  Left shoulder: FROM without pain.   Assessment & Plan:  1. Neck pain - consistent with cervical radiculopathy.  MRI showed findings more to the right side but possible has had some progression to include the left since that time.  Start with prednisone dose pack, transition to meloxicam.  Heat for spasms, discussed ergonomic issues.  Let me know how she's doing in 1-2 weeks.  Consider physical therapy, ESIs, repeat MRI.  F/u in 4 weeks otherwise.

## 2016-12-01 NOTE — Patient Instructions (Signed)
You have cervical radiculopathy (a pinched nerve in the neck). Prednisone 6 day dose pack to relieve irritation/inflammation of the nerve. Meloxicam 15mg  daily with food for pain and inflammation - start day AFTER finishing prednisone if prescribed this. Consider physical therapy for stretching, exercises, traction, and modalities. Heat 15 minutes at a time 3-4 times a day to help with spasms. Watch head position when on computers, texting, when sleeping in bed. Let me know how you're doing in 1-2 weeks. Typically I'd like to see you for follow up by 4 weeks from now.

## 2016-12-02 NOTE — Assessment & Plan Note (Signed)
consistent with cervical radiculopathy.  MRI showed findings more to the right side but possible has had some progression to include the left since that time.  Start with prednisone dose pack, transition to meloxicam.  Heat for spasms, discussed ergonomic issues.  Let me know how she's doing in 1-2 weeks.  Consider physical therapy, ESIs, repeat MRI.  F/u in 4 weeks otherwise.

## 2016-12-26 ENCOUNTER — Ambulatory Visit (INDEPENDENT_AMBULATORY_CARE_PROVIDER_SITE_OTHER): Payer: 59 | Admitting: Family Medicine

## 2016-12-26 ENCOUNTER — Encounter: Payer: Self-pay | Admitting: Family Medicine

## 2016-12-26 ENCOUNTER — Ambulatory Visit (INDEPENDENT_AMBULATORY_CARE_PROVIDER_SITE_OTHER): Payer: 59

## 2016-12-26 VITALS — BP 117/83 | HR 67 | Ht 67.0 in | Wt 112.0 lb

## 2016-12-26 DIAGNOSIS — M4802 Spinal stenosis, cervical region: Secondary | ICD-10-CM

## 2016-12-26 DIAGNOSIS — M5010 Cervical disc disorder with radiculopathy, unspecified cervical region: Secondary | ICD-10-CM

## 2016-12-26 DIAGNOSIS — M542 Cervicalgia: Secondary | ICD-10-CM

## 2016-12-26 DIAGNOSIS — M2578 Osteophyte, vertebrae: Secondary | ICD-10-CM

## 2016-12-26 MED ORDER — DIAZEPAM 5 MG PO TABS: ORAL_TABLET | ORAL | 0 refills | Status: DC

## 2016-12-26 NOTE — Patient Instructions (Signed)
With your insurance we have to repeat the x-rays before we can order the MRI- get x-rays downstairs as you leave today. We will then go ahead with MRI and I'll call you with results, next steps. Consider longer course of prednisone, other anti-inflammatory, muscle relaxant, pain medication. Can increase the gabapentin to twice a day then three times a day. We could also try nortriptyline.

## 2016-12-27 NOTE — Progress Notes (Signed)
PCP: Patient, No Pcp Per  Subjective:   HPI: Patient is a 46 y.o. female here for neck pain.  4/19: Patient reports she started having problems with her neck about 12 years ago. Problems have primarily been on the right side though with known C6-7 nerve root impingement leading to numbness. In past she has improved with prednisone dose packs. Has consulted with neurosurgery and had discussion re: injections, operative intervention but improved with conservative measures. Current issue started 3 weeks ago - getting numbness, tingling especially in tip of left index finger. Goes up arm and includes the thumb as well. Pain is 0/10. Has some spasms left side of neck and noticed some weakness trying to put seatbelt on when using left arm. No bowel/bladder dysfunction. No skin changes. No new injuries.  5/14: Patient returns reporting prednisone only helped mildly. Pain level is 3/10 and sharp posterior left shoulder and neck. Goes into left arm with numbness - started index finger but getting some in all fingers now except 5th digit. + night pain. Not taking meloxicam much. Takes gabapentin 300mg  at bedtime. No skin changes. No bowel/bladder dysfunction.  No past medical history on file.  Current Outpatient Prescriptions on File Prior to Visit  Medication Sig Dispense Refill  . Cyanocobalamin (RA VITAMIN B-12 TR) 1000 MCG TBCR Take by mouth.    . gabapentin (NEURONTIN) 300 MG capsule nightly 30 capsule 3  . meloxicam (MOBIC) 15 MG tablet Take 1 tablet (15 mg total) by mouth daily. 30 tablet 2  . predniSONE (DELTASONE) 10 MG tablet 6 tabs po day 1, 5 tabs po day 2, 4 tabs po day 3, 3 tabs po day 4, 2 tabs po day 5, 1 tab po day 6 21 tablet 0  . Turmeric Curcumin 500 MG CAPS Take by mouth.     No current facility-administered medications on file prior to visit.     No past surgical history on file.  No Known Allergies  Social History   Social History  . Marital status:  Married    Spouse name: N/A  . Number of children: N/A  . Years of education: N/A   Occupational History  . Not on file.   Social History Main Topics  . Smoking status: Never Smoker  . Smokeless tobacco: Never Used  . Alcohol use Not on file  . Drug use: Unknown  . Sexual activity: Not on file   Other Topics Concern  . Not on file   Social History Narrative  . No narrative on file    Family History  Problem Relation Age of Onset  . Sudden death Neg Hx   . Hypertension Neg Hx   . Hyperlipidemia Neg Hx   . Heart attack Neg Hx   . Diabetes Neg Hx     BP 117/83   Pulse 67   Ht 5\' 7"  (1.702 m)   Wt 112 lb (50.8 kg)   BMI 17.54 kg/m   Review of Systems: See HPI above.     Objective:  Physical Exam:  Gen: NAD, comfortable in exam room  Neck: No gross deformity, swelling, bruising. TTP mildly left paraspinal cervical region.  No midline/bony TTP. FROM neck - radiation down left arm with extension. BUE strength 5/5 except 4/5 with left elbow extension.   Sensation diminished in index finger on left. 2+ equal reflexes in triceps, biceps, brachioradialis tendons. Positive spurlings on left.  Left shoulder: FROM without pain.   Assessment & Plan:  1. Neck pain -  consistent with cervical radiculopathy.  Exam unchanged.  Prior MRI showed findings more to the right side but possible has had some progression to include the left since that time.  Given lack of improvement with prednisone will go ahead with repeat MRI.  Consider ESI, NCVs, neurosurgery referral, PT depending on result.  Meloxicam, heat for spasms in meantime.

## 2016-12-27 NOTE — Assessment & Plan Note (Signed)
consistent with cervical radiculopathy.  Exam unchanged.  Prior MRI showed findings more to the right side but possible has had some progression to include the left since that time.  Given lack of improvement with prednisone will go ahead with repeat MRI.  Consider ESI, NCVs, neurosurgery referral, PT depending on result.  Meloxicam, heat for spasms in meantime.

## 2016-12-30 ENCOUNTER — Other Ambulatory Visit: Payer: Self-pay | Admitting: Family Medicine

## 2016-12-30 DIAGNOSIS — M5412 Radiculopathy, cervical region: Secondary | ICD-10-CM

## 2017-01-10 ENCOUNTER — Ambulatory Visit
Admission: RE | Admit: 2017-01-10 | Discharge: 2017-01-10 | Disposition: A | Discharge status: Home / Self Care | Payer: 59 | Source: Ambulatory Visit | Attending: Family Medicine | Admitting: Family Medicine

## 2017-01-10 DIAGNOSIS — M5412 Radiculopathy, cervical region: Secondary | ICD-10-CM

## 2017-01-10 MED ORDER — TRIAMCINOLONE ACETONIDE 40 MG/ML IJ SUSP (RADIOLOGY)
60.0000 mg | Freq: Once | INTRAMUSCULAR | Status: AC
  Administered 2017-01-10: 60 mg via EPIDURAL

## 2017-01-10 MED ORDER — IOPAMIDOL (ISOVUE-M 300) INJECTION 61%
1.0000 mL | Freq: Once | INTRAMUSCULAR | Status: AC | PRN
  Administered 2017-01-10: 1 mL via EPIDURAL

## 2017-01-10 NOTE — Discharge Instructions (Signed)

## 2017-01-16 ENCOUNTER — Encounter: Payer: Self-pay | Admitting: Family Medicine

## 2017-06-26 ENCOUNTER — Other Ambulatory Visit: Payer: Self-pay | Admitting: Radiology

## 2018-01-03 ENCOUNTER — Other Ambulatory Visit: Payer: Self-pay | Admitting: Family Medicine

## 2018-01-03 DIAGNOSIS — N6320 Unspecified lump in the left breast, unspecified quadrant: Secondary | ICD-10-CM

## 2018-01-12 ENCOUNTER — Other Ambulatory Visit: Payer: 59

## 2018-01-16 ENCOUNTER — Ambulatory Visit
Admission: RE | Admit: 2018-01-16 | Discharge: 2018-01-16 | Disposition: A | Discharge status: Home / Self Care | Payer: 59 | Source: Ambulatory Visit | Attending: Family Medicine | Admitting: Family Medicine

## 2018-01-16 DIAGNOSIS — N6320 Unspecified lump in the left breast, unspecified quadrant: Secondary | ICD-10-CM

## 2018-01-16 MED ORDER — GADOBENATE DIMEGLUMINE 529 MG/ML IV SOLN
10.0000 mL | Freq: Once | INTRAVENOUS | Status: AC | PRN
  Administered 2018-01-16: 10 mL via INTRAVENOUS

## 2018-11-28 ENCOUNTER — Encounter: Payer: Self-pay | Admitting: Gastroenterology

## 2018-11-28 ENCOUNTER — Ambulatory Visit (INDEPENDENT_AMBULATORY_CARE_PROVIDER_SITE_OTHER): Payer: 59 | Admitting: Gastroenterology

## 2018-11-28 ENCOUNTER — Other Ambulatory Visit: Payer: Self-pay

## 2018-11-28 VITALS — Ht 67.0 in | Wt 112.0 lb

## 2018-11-28 DIAGNOSIS — K623 Rectal prolapse: Secondary | ICD-10-CM | POA: Diagnosis not present

## 2018-11-28 NOTE — Progress Notes (Signed)
TELEHEALTH VISIT  Referring Provider: Hayden Rasmussen, MD Primary Care Physician:  Stacy Roberts, No Pcp Per   Tele-visit due to COVID-19 pandemic Stacy Roberts requested visit virtually, consented to the virtual encounter via video enabled telemedicine application (Zoom) Contact made at: 13:30 pm 11/28/18 Stacy Roberts verified by name and date of birth Location of Stacy Roberts: Home Location provider: [facility] Names of persons participating: Me, Stacy Roberts, Tinnie Gens CMA Time spent on telehealth visit: 30 minutes I discussed the limitations of evaluation and management by telemedicine. The Stacy Roberts expressed understanding and agreed to proceed.  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 Sharlett Iles    - Performed for diarrhea and weight loss    - Duodenal and terminal ileal biopsies were normal Redundant and tortuous colon on colonoscopy 2009 Family history of colon cancer (Paternal grandmother in her 73s) Stacy Roberts reports history of IBS with fluctuating bowel habits  Unfortunately, I am unable to examine the Stacy Roberts today. Therefore, the differential includes partial rectal prolapse, prolapsed internal hemorrhoids, and pelvic floor anatomic defects. By history, there sounds to be a component of pelvic floor dysfunction +/- pelvic floor anatomic defects (eg, rectocele, cystocele, enterocele, deep cul-de-sac).   Continue with pelvic floor exercises as previously learned by [facility]. Obtain anal rectal manometry and balloon expulsion study when we are able (after Covid19 restrictions are lifted). Proceed with colonoscopy for full evaluation and to exclude concurrent processes.   Will arrange surgical consultation to determine operative  eligibility.     PLAN: Obtain records from Dr. [name] Resume pelvic floor exercises as previously prescribed by PT Ensure adequate fluid and fiber intake (total 25 to 30 grams of fiber per day, 1-2 liters per day of water)  Referral to Dr. Dema Severin at [facility] for rectal prolapse Anal rectal manometry and balloon expulsion with Covid restrictions are lifted Colonoscopy when Covid restritions are lifted   HPI: Stacy Roberts is a 48 y.o. woman referred by Dr. Horald Pollen for further evaluation of rectal prolapse.  The history is obtained through the Stacy Roberts, review of her referral records, and review of her electronic health record.  Patients with rectal procidentia present with symptoms that include abdominal discomfort, incomplete bowel evacuation, and mucus and/or stool discharge associated with altered bowel habits, and a "mass" that prolapses through the anus that may reduce spontaneously or require manual reduction [14,15]. Straining to initiate or complete defecation, incomplete evacuation, and a history of digital maneuvers to aid with defecation can occur as the prolapse progresses. Pain is not a typical presenting feature and suggests another diagnosis   4 years of long distance running. Tapered off due to injuries this year. Running 5-6 miles a few times a week. Developed the acute onset of rectal pain after a run earlier this year. Pain lasted 5 minutes. Noted a bulge adjacent to her anus.    Fecal leakage. Just on the toilet paper.   Stacy Roberts is able to control defecation.  No  lack of awareness of the need to defecate. No fecal urgency. No nocturnal episodes. Bowel habits fluctuate from 5-6 BM daily to once daily. Attributes the fluctuation to IBS.  No recent change in stool consistency. No decreased rectal sensation.  Perineal pain and rectal pain are present.  Evaluated by Dr. [name] and diagnosed with rectal and bladder prolapse. Has seen [facility]. 4-5  appointments. Insurance has since change and is no longer covered.   Blood last week on the side of her stools. Evaluated by anoscopy last week. Hemorrhoid seen on anoscopy with Dr. [name].  Treatment offered with suppositories. No further bleeding. Feels like something is falling out, like a tampon but there is no tampon present. She is able to reduce the prolapse, but finds that it recurs nearly as soon as it is reduced.  Now she is afraid to walk or run. No other associated symptoms. No identified exacerbating or relieving features.   Six prior pregnancies, three vaginal deliveries. Episiotomy with her first birth who was 9 pounds. No history of prior hemorrhoid, fissure or anorectal surgery, pelvic irradiation, or neurologic disease.   Bouts of IBS symptoms over the years.  Tested for celiac today and remembers having a positive celiac test in the past.   Paternal grandmother with colon cancer in her 37s. No known family history of colon cancer or polyps. No family history of uterine/endometrial cancer, pancreatic cancer or gastric/stomach cancer.  The Stacy Roberts reports positive celiac markers, but, the labs available to me from 2009 are normal.  EGD with Dr. Sharlett Iles 04/30/2018 for weight loss and diarrhea was normal.  Duodenal biopsies were negative for celiac disease.  Colonoscopy showed a very long, redundant, and tortuous colon.  Terminal ileal biopsies were normal.    History reviewed. No pertinent past medical history.  History reviewed. No pertinent surgical history.  Current Outpatient Medications  Medication Sig Dispense Refill   Cyanocobalamin (RA VITAMIN B-12 TR) 1000 MCG TBCR Take by mouth.     Turmeric Curcumin 500 MG CAPS Take by mouth.     Multiple Vitamins-Minerals (MULTIVITAMIN ADULT PO) Take by mouth.     No current facility-administered medications for this visit.     Allergies as of 11/28/2018   (No Known Allergies)    Family History  Problem Relation Age  of Onset   Colon cancer Paternal Grandmother    Sudden death Neg Hx    Hypertension Neg Hx    Hyperlipidemia Neg Hx    Heart attack Neg Hx    Diabetes Neg Hx     Social History   Socioeconomic History   Marital status: Married    Spouse name: Not on file   Number of children: 3   Years of education: Not on file   Highest education level: Not on file  Occupational History   Occupation: stay at home mom  Social Needs   Financial resource strain: Not on file   Food insecurity:    Worry: Not on file    Inability: Not on file   Transportation needs:    Medical: Not on file    Non-medical: Not on file  Tobacco Use   Smoking status: Never Smoker   Smokeless tobacco: Never Used  Substance and Sexual Activity   Alcohol use: Not on file   Drug use: Not on file   Sexual activity: Not on file  Lifestyle   Physical activity:    Days per week: Not on file    Minutes per session: Not on file   Stress: Not on file  Relationships   Social connections:    Talks on phone: Not on file    Gets together: Not on file    Attends religious service: Not on file    Active member of club or  organization: Not on file    Attends meetings of clubs or organizations: Not on file    Relationship status: Not on file   Intimate partner violence:    Fear of current or ex partner: Not on file    Emotionally abused: Not on file    Physically abused: Not on file    Forced sexual activity: Not on file  Other Topics Concern   Not on file  Social History Narrative   Not on file    Review of Systems: ALL ROS discussed and all others negative except listed in HPI.  Physical Exam: General: in no acute distress Neuro: Alert and appropriate Psych: Normal affect and normal insight Exam is otherwise limited including a rectal exam given that telehealth encounter.  Crystalmarie Yasin L. Tarri Glenn, MD, MPH [facility] 11/28/2018, 3:09 PM

## 2018-11-28 NOTE — Patient Instructions (Addendum)
Focus on adequate  fluid and fiber intake (total 25 to 30 grams of fiber per day, 1-2 liters per day of water)   I will arrange a referral to Dr. Dema Severin at [facility] for rectal prolapse.   I am recommending a colonoscopy and anal rectal manometry and balloon expulsion when Covid restrictions are lifted.  Thank you for your patience with me and our technology today! Please stay home, safe, and healthy. I look forward to meeting you in person in the future.

## 2018-12-31 ENCOUNTER — Encounter (HOSPITAL_COMMUNITY): Payer: Self-pay

## 2018-12-31 ENCOUNTER — Ambulatory Visit (HOSPITAL_COMMUNITY): Admit: 2018-12-31 | Payer: 59 | Admitting: Gastroenterology

## 2018-12-31 SURGERY — MANOMETRY, ANORECTAL

## 2019-01-18 ENCOUNTER — Ambulatory Visit (AMBULATORY_SURGERY_CENTER): Payer: Self-pay | Admitting: *Deleted

## 2019-01-18 ENCOUNTER — Other Ambulatory Visit: Payer: Self-pay

## 2019-01-18 ENCOUNTER — Encounter: Payer: Self-pay | Admitting: Gastroenterology

## 2019-01-18 VITALS — Ht 67.0 in | Wt 118.0 lb

## 2019-01-18 DIAGNOSIS — K625 Hemorrhage of anus and rectum: Secondary | ICD-10-CM

## 2019-01-18 MED ORDER — NA SULFATE-K SULFATE-MG SULF 17.5-3.13-1.6 GM/177ML PO SOLN: 1.0000 | Freq: Once | ORAL | 0 refills | Status: AC

## 2019-01-18 NOTE — Progress Notes (Signed)
No egg or soy allergy known to patient  No issues with past sedation with any surgeries  or procedures, no intubation problems  No diet pills per patient No home 02 use per patient  No blood thinners per patient  Pt denies issues with constipation  No A fib or A flutter  EMMI video sent to pt's e mail  PV in person today- Suprep $15 coupon to pt.

## 2019-01-23 ENCOUNTER — Telehealth: Payer: Self-pay | Admitting: *Deleted

## 2019-01-23 NOTE — Telephone Encounter (Signed)

## 2019-01-24 ENCOUNTER — Telehealth: Payer: Self-pay | Admitting: Gastroenterology

## 2019-01-24 NOTE — Telephone Encounter (Signed)
Noted. Unfortunately colonoscopy and rectal manometry cannot be performed on the same day. Thank you for working with the patient and answering her questions. I look forward to seeing her tomorrow.

## 2019-01-24 NOTE — Telephone Encounter (Signed)
Spoke with patient she is concerned proceeding with colonoscopy and anal rectal manometry without knowing how much she will have to pay out of pocket because she has a high deductible. I explained to her that the manometry is generally scheduled at the hospital and it will be a separate cost from the colonoscopy. She will let us know after her colonoscopy if she would like to have manometry. Also patient states she did not schedule with [facility] because she would like to see what colonoscopy shows first before proceeding. She will proceed with the colonoscopy tomorrow.

## 2019-01-24 NOTE — Telephone Encounter (Signed)
Patient is scheduled to have a colonoscopy tomorrow but due to insurance/financial problems the patient would like to know if she can have the colonoscopy and RECTAL MANOMETRY done at the same time. She is still currently scheduled for tomorrow but would like to cancel if she can have both procedure together.

## 2019-01-25 ENCOUNTER — Encounter: Payer: Self-pay | Admitting: Gastroenterology

## 2019-01-25 ENCOUNTER — Other Ambulatory Visit: Payer: Self-pay

## 2019-01-25 ENCOUNTER — Ambulatory Visit (AMBULATORY_SURGERY_CENTER): Payer: 59 | Admitting: Gastroenterology

## 2019-01-25 VITALS — BP 105/60 | HR 70 | Temp 98.3°F | Resp 18 | Ht 67.0 in | Wt 118.0 lb

## 2019-01-25 DIAGNOSIS — K625 Hemorrhage of anus and rectum: Secondary | ICD-10-CM

## 2019-01-25 DIAGNOSIS — D122 Benign neoplasm of ascending colon: Secondary | ICD-10-CM

## 2019-01-25 DIAGNOSIS — K635 Polyp of colon: Secondary | ICD-10-CM

## 2019-01-25 MED ORDER — SODIUM CHLORIDE 0.9 % IV SOLN: 500.0000 mL | Freq: Once | INTRAVENOUS | Status: DC

## 2019-01-25 NOTE — Progress Notes (Signed)
PT taken to PACU. Monitors in place. VSS. Report given to RN. 

## 2019-01-25 NOTE — Progress Notes (Signed)
Stacy Roberts

## 2019-01-25 NOTE — Op Note (Addendum)
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                            Sharlett Iles                           - Performed for diarrhea and weight loss                           - Duodenal and terminal ileal biopsies were normal                           Redundant and tortuous colon on colonoscopy 2009                           Family history of colon cancer (Paternal                            grandmother in her 38s)                           Patient reports history of IBS with fluctuating                            bowel habits Medicines:                See the Anesthesia note for documentation of the                            administered medications Procedure:                Pre-Anesthesia Assessment:                           - Prior to the procedure, a History and Physical  was performed, and patient medications and                            allergies were reviewed. The patient's tolerance of                            previous anesthesia was also reviewed. The risks                            and benefits of the procedure and the sedation                            options and risks were discussed with the patient.                            All questions were answered, and informed consent                            was obtained. Prior Anticoagulants: The patient has                            taken  no previous anticoagulant or antiplatelet                            agents. ASA Grade Assessment: I - A normal, healthy                            patient. After reviewing the risks and benefits,                            the patient was deemed in satisfactory condition to                            undergo the procedure.                           After obtaining informed consent, the colonoscope                            was passed under direct vision. Throughout the                            procedure, the patient's blood pressure, pulse, and                            oxygen saturations were monitored continuously. The                            Colonoscope was introduced through the anus and                            advanced to the the terminal ileum, with  identification of the appendiceal orifice and IC                            valve. The colonoscopy was performed with moderate                            difficulty due to significant looping and a                            tortuous colon. Successful completion of the                            procedure was aided by applying abdominal pressure.                            The patient tolerated the procedure well. The                            quality of the bowel preparation was good. The                            terminal ileum, ileocecal valve, appendiceal                            orifice, and rectum were photographed. Scope In: 2:19:17 PM Scope Out: 2:40:56 PM Scope Withdrawal Time: 0 hours 15 minutes 46 seconds  Total Procedure Duration: 0 hours 21 minutes 39 seconds  Findings:                 Skin tags were found on perianal exam.                           A 8 mm polyp was found in the proximal ascending                            colon. The polyp was flat. The polyp was removed                            with a cold snare. Resection and retrieval were                            complete.  Estimated blood loss was minimal.                           A 1 mm polyp was found in the distal ascending                            colon. The polyp was sessile. The polyp was removed                            with a cold biopsy forceps. Resection and retrieval  were complete. Estimated blood loss: none.                           There was some bleeding that occurred after                            retroflex views of the rectum due to scope trauma.                            There was no bleeding noted at the conclusion of                            the colonoscopy. The exam was otherwise without                            abnormality on direct and retroflexion views. Complications:            No immediate complications. Estimated blood loss:                            Minimal. Estimated Blood Loss:     Estimated blood loss was minimal. There was some                            bleeding that occurred after retroflex views of the                            rectum due to scope trauma. Impression:               - Perianal skin tags found on perianal exam.                           - One 8 mm polyp in the proximal ascending colon,                            removed with a cold snare. Resected and retrieved.                           - One 1 mm polyp in the distal ascending colon,                            removed with a cold biopsy forceps. Resected and                            retrieved.                           - The examination was otherwise normal on direct                            and retroflexion views. Scope trauma occurred                            during retroflexed views of the rectum.                           -  Source of recent bleeding not identified on this                            examination. Suspect healed anal fissure. Recommendation:           - Patient has a contact number available for                            emergencies. The signs  and symptoms of potential                            delayed complications were discussed with the                            patient. Return to normal activities tomorrow.                            Written discharge instructions were provided to the                            patient.                           - Resume regular diet today.                           - Continue present medications.                           - Await pathology results.                           - Repeat colonoscopy date to be determined after                            pending pathology results are reviewed for                            surveillance based on pathology results. Thornton Park MD, MD 01/25/2019 2:51:57 PM This report has been signed electronically.

## 2019-01-25 NOTE — Progress Notes (Signed)
Called to room to assist during endoscopic procedure.  Patient ID and intended procedure confirmed with present staff. Received instructions for my participation in the procedure from the performing physician.  

## 2019-01-25 NOTE — Patient Instructions (Signed)
YOU HAD AN ENDOSCOPIC PROCEDURE TODAY AT [facility]:   Refer to the procedure report that was given to you for any specific questions about what was found during the examination.  If the procedure report does not answer your questions, please call your gastroenterologist to clarify.  If you requested that your care partner not be given the details of your procedure findings, then the procedure report has been included in a sealed envelope for you to review at your convenience later.  YOU SHOULD EXPECT: Some feelings of bloating in the abdomen. Passage of more gas than usual.  Walking can help get rid of the air that was put into your GI tract during the procedure and reduce the bloating. If you had a lower endoscopy (such as a colonoscopy or flexible sigmoidoscopy) you may notice spotting of blood in your stool or on the toilet paper. If you underwent a bowel prep for your procedure, you may not have a normal bowel movement for a few days.  Please Note:  You might notice some irritation and congestion in your nose or some drainage.  This is from the oxygen used during your procedure.  There is no need for concern and it should clear up in a day or so.  SYMPTOMS TO REPORT IMMEDIATELY:   Following lower endoscopy (colonoscopy or flexible sigmoidoscopy):  Excessive amounts of blood in the stool  Significant tenderness or worsening of abdominal pains  Swelling of the abdomen that is new, acute  Fever of 100F or higher  For urgent or emergent issues, a gastroenterologist can be reached at any hour by calling [phone].  DIET:  We do recommend a small meal at first, but then you may proceed to your regular diet.  Drink plenty of fluids but you should avoid alcoholic beverages for 24 hours.  ACTIVITY:  You should plan to take it easy for the rest of today and you should NOT DRIVE or use heavy machinery until tomorrow (because of the sedation medicines used during the test).     FOLLOW UP: Our staff will call the number listed on your records 48-72 hours following your procedure to check on you and address any questions or concerns that you may have regarding the information given to you following your procedure. If we do not reach you, we will leave a message.  We will attempt to reach you two times.  During this call, we will ask if you have developed any symptoms of COVID 19. If you develop any symptoms (ie: fever, flu-like symptoms, shortness of breath, cough etc.) before then, please call [phone].  If you test positive for Covid 19 in the 2 weeks post procedure, please call and report this information to [place].    If any biopsies were taken you will be contacted by phone or by letter within the next 1-3 weeks.  Please call us at [phone] if you have not heard about the biopsies in 3 weeks.    SIGNATURES/CONFIDENTIALITY: You and/or your care partner have signed paperwork which will be entered into your electronic medical record.  These signatures attest to the fact that that the information above on your After Visit Summary has been reviewed and is understood.  Full responsibility of the confidentiality of this discharge information lies with you and/or your care-partner.  Await pathology  Please read over handout about polyps  Continue your normal medications

## 2019-01-25 NOTE — Progress Notes (Signed)
Pt's states no medical or surgical changes since previsit or office visit. 

## 2019-01-29 ENCOUNTER — Telehealth: Payer: Self-pay

## 2019-01-29 NOTE — Telephone Encounter (Signed)
1. Have you developed a fever since your procedure? No.  2.   Have you had an respiratory symptoms (SOB or cough) since your procedure? No.  3.   Have you tested positive for COVID 19 since your procedure No.  4.   Have you had any family members/close contacts diagnosed with the COVID 19 since your procedure?  No.   If yes to any of these questions please route to Joylene John, RN and Alphonsa Gin, RN.  Follow up Call-  Call back number 01/25/2019  Post procedure Call Back phone  # [phone]  Permission to leave phone message Yes  Some recent data might be hidden     Patient questions:  Do you have a fever, pain , or abdominal swelling? No. Pain Score  0 *  Have you tolerated food without any problems? Yes.    Have you been able to return to your normal activities? Yes.    Do you have any questions about your discharge instructions: Diet   No. Medications  No. Follow up visit  No.  Do you have questions or concerns about your Care? No.  Actions: * If pain score is 4 or above: No action needed, pain <4.

## 2019-02-01 ENCOUNTER — Encounter: Payer: Self-pay | Admitting: Gastroenterology

## 2019-04-30 ENCOUNTER — Encounter: Payer: Self-pay | Admitting: Obstetrics and Gynecology

## 2019-05-09 ENCOUNTER — Other Ambulatory Visit: Payer: Self-pay

## 2019-05-13 ENCOUNTER — Ambulatory Visit (INDEPENDENT_AMBULATORY_CARE_PROVIDER_SITE_OTHER): Payer: 59 | Admitting: Obstetrics and Gynecology

## 2019-05-13 ENCOUNTER — Other Ambulatory Visit: Payer: Self-pay

## 2019-05-13 ENCOUNTER — Encounter: Payer: Self-pay | Admitting: Obstetrics and Gynecology

## 2019-05-13 VITALS — BP 112/64 | HR 64 | Temp 97.9°F | Resp 14 | Ht 67.5 in | Wt 113.8 lb

## 2019-05-13 DIAGNOSIS — R159 Full incontinence of feces: Secondary | ICD-10-CM

## 2019-05-13 NOTE — Progress Notes (Signed)
48 y.o. [phone] Married Caucasian female here for pelvic prolapse.   Patient complains of fecal incontinence and vaginal prolapse.  Bulge for 6 months.  Notices fecal soiling when she wipes for years.  She does have some fecal urgency.  Thinks she has IBS but has not had a dx of this.  She does have to strain at times to do BMs.  No splinting.   Is an avid runner, and she noticed perineal pain when she was running.  She check herself later, and she noticed a bulge.  She feels often like a tampon is falling out.   No urinary incontinence.  Intermittent urgency and frequency.   She has done pelvic floor therapy for about 4 months, arranged through her PCP. She went to [facility] for this.  This did not help.  She has had a colonoscopy due to rectal bulge and bleeding.  Dx was hemorrhoids.  She was due for anal manometry, but did not have testing during the pandemic so far.  3 children.  All vaginal deliveries.  Largest was 9 pounds and had a compound presentation with a fist.  She had a Kiwi vacuum for this delivery.  She thinks she had a more extensive laceration but not complete.   She had a Mirena IUD and it has been out for 1 year.  Declines future childbearing.  No pain with intercourse.   She has not had a cycle since July 2015. No hot flashes or night sweats.  Common for her to skip her cycles.  She has had testing "here and there", and she has been offered birth control pills to control this.  She had labs with her PCP - 07/04/18 - LH 44, FSH 53.3.  Her thyroid testing has also been normal  PCP:  Horald Pollen, MD  Patient's last menstrual period was 02/27/2019 (approximate).     Period Cycle (Days): (had Mirena IUD x2--no cycle for 10 years--IUD removed 1 year ago--cycle 02-27-19)     Sexually active: Yes.    The current method of family planning is condoms all the time.    Exercising: Yes.    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 She has never used smokeless tobacco. She reports current alcohol use of about 1.0 standard drinks of alcohol per week. She reports that she does not use drugs.  Past Medical History:  Diagnosis Date  . Allergy   . Cancer (HCC)    Basal cell, underarm and behind ear  . Fibroid   . Rectal bleeding     Past Surgical History:  Procedure Laterality Date  . COLONOSCOPY    . DENTAL SURGERY    . DILATION AND CURETTAGE OF UTERUS     x2  . MOUTH SURGERY  1990    Current Outpatient Medications  Medication Sig Dispense Refill  . Cholecalciferol (VITAMIN D3 PO) Take 1 capsule by mouth daily. Takes 1071mcg    . Cyanocobalamin (RA VITAMIN B-12 TR) 1000 MCG TBCR Take by mouth.    . fluticasone (FLONASE) 50 MCG/ACT nasal spray Place into the nose.    Marland Kitchen  Lactobacillus Rhamnosus, GG, (CULTURELLE) CAPS Take by mouth. Probiotic daily    . Multiple Vitamins-Minerals (MULTIVITAMIN ADULT PO) Take by mouth.    . Turmeric Curcumin 500 MG CAPS Take by mouth.     No current facility-administered medications for this visit.     Family History  Problem Relation Age of Onset  . Colon cancer Paternal Grandmother   . Breast cancer Paternal Grandmother   . Thyroid disease Mother        [place]  . Cancer Mother        Thyroid CA  . Thyroid disease Father   . Congenital heart disease Maternal Grandmother   . Sudden death Neg Hx   . Hypertension Neg Hx   . Hyperlipidemia Neg Hx   . Heart attack Neg Hx   . Diabetes Neg Hx   . Colon polyps Neg Hx   . Esophageal cancer Neg Hx   . Rectal cancer Neg Hx   . Stomach cancer Neg Hx     Review of Systems   Constitutional:       Body aches  Gastrointestinal:       Fecal incontinence  All other systems reviewed and are negative.   Exam:   BP 112/64   Pulse 64   Temp 97.9 F (36.6 C) (Temporal)   Resp 14   Ht 5' 7.5" (1.715 m)   Wt 113 lb 12.8 oz (51.6 kg)   LMP 02/27/2019 (Approximate)   BMI 17.56 kg/m     General appearance: alert, cooperative and appears stated age Head: normocephalic, without obvious abnormality, atraumatic Neck: no adenopathy, supple, symmetrical, trachea midline and thyroid normal to inspection and palpation Lungs: clear to auscultation bilaterally Heart: regular rate and rhythm Abdomen: soft, non-tender; no masses, no organomegaly Extremities: extremities normal, atraumatic, no cyanosis or edema Skin: skin color, texture, turgor normal. No rashes or lesions Neurologic: grossly normal  Pelvic: External genitalia:  no lesions              No abnormal inguinal nodes palpated.              Urethra:  normal appearing urethra with no masses, tenderness or lesions              Bartholins and Skenes: normal                 Vagina: normal appearing vagina with normal color and discharge, no lesions.  Minimal urethrocele.  Minimal rectocele.               Cervix: no lesions         Bimanual Exam:  Uterus:  normal size, contour, position, consistency, mobility, non-tender              Adnexa: no mass, fullness, tenderness              Rectal exam: Yes.  .  Confirms.              Anus:  normal sphincter tone, no lesions  Chaperone was present for exam.  Assessment:    Fecal soiling.  Some loss of perineal support.  Irregular menses.  Skipped cycles.  Perimenopausal. FH thyroid disease.   Plan:   Pelvic anatomy reviewed with a 3D model.  We discussed obstetric laceration as a frequent cause of fecal soiling.  I suspect her high impact exercise has also had an impact on her more recent symptoms.  We discussed further evaluation with a subspecialist well  versed in fecal incontinence.  She would like to see Dr. Blossom Hoops.  Referral placed.   She may try some Metamucil to see if she has a positive result for her bowel control. Fu prn.   After visit summary provided.   ___30____ minutes face to face time of which over 50% was spent in counseling.

## 2019-05-13 NOTE — Patient Instructions (Signed)
Fecal Incontinence Fecal incontinence, also called accidental bowel leakage, is not being able to control your bowels. This condition happens because the nerves or muscles around the anus do not work the way they should. This affects their ability to hold stool (feces). What are the causes? This condition may be caused by:  Damage to the muscles at the end of the rectum (sphincter).  Damage to the nerves that control bowel movements.  Diarrhea.  Chronic constipation.  Pelvic floor dysfunction. This means the muscles in the pelvis do not work well.  Loss of bowel storage capacity. This occurs when the rectum can no longer stretch in size in order to store feces.  Inflammatory bowel disease (IBD), such as Crohn's disease.  Irritable bowel syndrome (IBS). What increases the risk? You are more likely to develop this condition if you:  Were born with bowels or a pelvis that did not form correctly.  Have had rectal surgery.  Have had radiation treatment for certain cancers.  Have been pregnant, had a vaginal delivery, or had surgery that damaged the pelvic floor muscles.  Had a complicated childbirth, spinal cord injury, or other trauma that caused nerve damage.  Have a condition that can affect nerve function, such as diabetes, Parkinson's disease, or multiple sclerosis.  Have a condition where the rectum drops down into the anus or vagina (prolapse).  Are 27 years of age or older. What are the signs or symptoms? The main symptom of this condition is not being able to control your bowels. You also might not be able to get to the bathroom before a bowel movement. How is this diagnosed? This condition is diagnosed with a medical history and physical exam. You may also have other tests, including:  Blood tests.  Urine tests.  A rectal exam.  Ultrasound.  MRI.  Colonoscopy. This is an exam that looks at your large intestine (colon).  Anal manometry. This is a test that  measures the strength of the anal sphincter.  Anal electromyogram (EMG). This is a test that uses small electrodes to check for nerve damage. How is this treated? Treatment for this condition depends on the cause and severity. Treatment may also focus on addressing any underlying causes of this condition. Treatment may include:  Medicines. This may include medicines to: ? Prevent diarrhea. ? Help with constipation (bulk-forming laxatives). ? Treat any underlying conditions.  Biofeedback therapy. This can help to retrain muscles that are affected.  Fiber supplements. These can help manage your bowel movements.  Nerve stimulation.  Injectable gel to promote tissue growth and better muscle control.  Surgery. You may need: ? Sphincter repair surgery. ? Diversion surgery. This procedure lets feces pass out of your body through a hole in your abdomen. Follow these instructions at home: Eating and drinking   Follow instructions from your health care provider about any eating or drinking restrictions. ? Work with a dietitian to come up with a healthy diet that will help you avoid the foods that can make your condition worse. ? Keep a diet diary to find out which foods or drinks could be making your condition worse.  Drink enough fluid to keep your urine pale yellow. Lifestyle  Do not use any products that contain nicotine or tobacco, such as cigarettes and e-cigarettes. If you need help quitting, ask your health care provider. This may help your condition.  If you are overweight, talk with your health care provider about how to safely lose weight. This may help  your condition.  Increase your physical activity as told by your health care provider. This may help your condition. Always talk with your health care provider before starting a new exercise program.  Carry a change of clothes and supplies to clean up quickly if you have an episode of fetal incontinence.  Consider joining a  fecal incontinence support group. You can find a support group online or in your local community. General instructions   Take over-the-counter and prescription medicines only as told by your health care provider. This includes any supplements.  Apply a moisture barrier, such as petroleum jelly, to your rectum. This protects the skin from irritation caused by ongoing leaking or diarrhea.  Tell your health care provider if you are upset or depressed about your condition.  Keep all follow-up visits as told by your health care provider. This is important. Where to find more information  International Foundation for Functional Gastrointestinal Disorders: iffgd.[place] [facility]: patients.gi.org Contact a health care provider if:  You have a fever.  You have redness, swelling, or pain around your rectum.  Your pain is getting worse or you lose feeling in your rectal area.  You have blood in your stool.  You feel sad or hopeless.  You avoid social or work situations. Get help right away if:  You stop having bowel movements.  You cannot eat or drink without vomiting.  You have rectal bleeding that does not stop.  You have severe pain that is getting worse.  You have symptoms of dehydration, including: ? Sleepiness or fatigue. ? Producing little or no urine, tears, or sweat. ? Dizziness. ? Dry mouth. ? Unusual irritability. ? Headache. ? Inability to think clearly. Summary  Fecal incontinence, also called accidental bowel leakage, is not being able to control your bowels. This condition happens because the nerves or muscles around the anus do not work the way they should.  Treatment varies depending on the cause and severity of your condition. Treatment may also focus on addressing any underlying causes of this condition.  Follow instructions from your health care provider about any eating or drinking restrictions, lifestyle changes, and skin care.   Take over-the-counter and prescription medicines only as told by your health care provider. This includes any supplements.  Tell your health care provider if your symptoms worsen or if you are upset or depressed about your condition. This information is not intended to replace advice given to you by your health care provider. Make sure you discuss any questions you have with your health care provider. Document Released: 07/13/2004 Document Revised: 12/14/2017 Document Reviewed: 12/14/2017 Elsevier Patient Education  2020 [employer].

## 2019-05-15 ENCOUNTER — Telehealth: Payer: Self-pay | Admitting: Obstetrics and Gynecology

## 2019-05-15 NOTE — Telephone Encounter (Signed)
Left voicemail regarding referral appointment. The information is listed below. Should the patient need to cancel or reschedule this appointment, Please advise them to call the office they've been referred to in order to reschedule.  [facility] [address] [place], [place] [address] [phone]   06/21/19/@ 8:00 am. Please arrive 15 minutes early and bring your insurance card and photo id and list of medications.

## 2019-05-20 NOTE — Telephone Encounter (Signed)
Patient returned call. Confirmed appointment information as per below.   Routing to provider and will close encounter.

## 2019-12-26 ENCOUNTER — Other Ambulatory Visit: Payer: Self-pay

## 2019-12-26 NOTE — Progress Notes (Signed)
Cardiology Office Note:   Date:  12/27/2019  NAME:  Stacy Roberts    MRN: [id number] DOB:  25-Jul-1971   PCP:  Hayden Rasmussen, MD  Cardiologist:  Evalina Field, MD    Referring MD: Barbaraann Cao, FNP   Chief Complaint  Patient presents with  . Palpitations   History of Present Illness:   Stacy Roberts is a 49 y.o. female with a hx of allergies who is being seen today for the evaluation of palpitations at the request of Windle Guard, Beverely Risen, FNP.  She reports that she has had intermittent episodes of palpitations for nearly 10 years.  Apparently over the last 1 year she is had episodes that occur in rapid succession.  She reports she can go several months without any symptoms but they can occur very frequently over a 2 to 3-week period.  She reports that she gets a sensation of rapid heartbeat.  She has checked her pulse at times and used a heart rate monitor on her watch which demonstrates heart rates in the 140-160.  Symptoms last few minutes and resolve without intervention.  They can occur anytime in the morning or with exercise.  No identifiable trigger.  No alleviating factors reported.  She reports they go away on their own.  She consumes 2 to 3 cups of coffee per day.  She is noted no association with caffeine consumption.  She does not consume energy drinks.  She used to be long-distance runner but does not do this anymore.  She does walk 2 to 3 days/week and has no limitations such as chest pain or shortness of breath.  She reports that her primary care physician has recently checked her thyroid and they will follow-up the results of that.  She has no chronic medical problems and takes no chronic medications.  She reports she is likely going through menopause and that is an issue she is dealing with.  Her EKG today demonstrates normal sinus rhythm without any acute changes and there are no concerning findings suggestive of ischemia or prior infarction.  She is a never smoker and consumes  alcohol in moderation.  There is family history of heart disease but she is never had any issues when I can tell.  Regarding her symptoms she is had no palpitations in the past 1 month.  She reports they just come and go and she is not able to really identify timeframe.  She is not having them currently.  She has expressed a desire to hold on any evaluation and she is not having any symptoms.  Past Medical History: Past Medical History:  Diagnosis Date  . Allergy   . Cancer (HCC)    Basal cell, underarm and behind ear  . Fibroid   . Rectal bleeding     Past Surgical History: Past Surgical History:  Procedure Laterality Date  . COLONOSCOPY    . DENTAL SURGERY    . DILATION AND CURETTAGE OF UTERUS     x2  . MOUTH SURGERY  1990    Current Medications: Current Meds  Medication Sig  . Cholecalciferol (VITAMIN D3 PO) Take 1 capsule by mouth daily. Takes 1079mcg  . Cholecalciferol (VITAMIN D3) 100000 UNIT/GM POWD Take by mouth.  . Cyanocobalamin (RA VITAMIN B-12 TR) 1000 MCG TBCR Take by mouth.  . fluocinonide cream (LIDEX) 0.05 % Apply topically.  . Lactobacillus Rhamnosus, GG, (CULTURELLE) CAPS Take by mouth. Probiotic daily  . Multiple Vitamins-Minerals (MULTIVITAMIN ADULT PO) Take by mouth.  Marland Kitchen  Turmeric Curcumin 500 MG CAPS Take by mouth.     Allergies:    Patient has no known allergies.   Social History: Social History   Socioeconomic History  . Marital status: Married    Spouse name: Not on file  . Number of children: 3  . Years of education: Not on file  . Highest education level: Not on file  Occupational History  . Occupation: stay at home mom  Tobacco Use  . Smoking status: Never Smoker  . Smokeless tobacco: Never Used  Substance and Sexual Activity  . Alcohol use: Yes    Alcohol/week: 1.0 standard drinks    Types: 1 Glasses of wine per week    Comment: socially   . Drug use: Never  . Sexual activity: Yes    Birth control/protection: Condom    Comment:  condom everytime  Other Topics Concern  . Not on file  Social History Narrative  . Not on file   Social Determinants of Health   Financial Resource Strain:   . Difficulty of Paying Living Expenses:   Food Insecurity:   . Worried About Charity fundraiser in the Last Year:   . Arboriculturist in the Last Year:   Transportation Needs:   . Film/video editor (Medical):   Marland Kitchen Lack of Transportation (Non-Medical):   Physical Activity:   . Days of Exercise per Week:   . Minutes of Exercise per Session:   Stress:   . Feeling of Stress :   Social Connections:   . Frequency of Communication with Friends and Family:   . Frequency of Social Gatherings with Friends and Family:   . Attends Religious Services:   . Active Member of Clubs or Organizations:   . Attends Archivist Meetings:   Marland Kitchen Marital Status:      Family History: The patient's family history includes Breast cancer in her paternal grandmother; Cancer in her mother; Colon cancer in her paternal grandmother; Congenital heart disease in her maternal grandmother; Heart failure in her maternal grandmother; Thyroid disease in her father and mother. There is no history of Sudden death, Hypertension, Hyperlipidemia, Heart attack, Diabetes, Colon polyps, Esophageal cancer, Rectal cancer, or Stomach cancer.  ROS:   All other ROS reviewed and negative. Pertinent positives noted in the HPI.     EKGs/Labs/Other Studies Reviewed:   The following studies were personally reviewed by me today:  EKG:  EKG is ordered today.  The ekg ordered today demonstrates sinus bradycardia, heart rate 54, no acute ST-T changes, no evidence of prior infarction, and was personally reviewed by me.   Recent Labs: No results found for requested labs within last 8760 hours.   Recent Lipid Panel    Component Value Date/Time   CHOL 176 09/22/2006 1133   TRIG 72 09/22/2006 1133   HDL 54.3 09/22/2006 1133   CHOLHDL 3.2 CALC 09/22/2006 1133   VLDL  14 09/22/2006 1133   LDLCALC 107 (H) 09/22/2006 1133    Physical Exam:   VS:  BP 122/82   Pulse (!) 54   Ht 5\' 7"  (1.702 m)   Wt 113 lb (51.3 kg)   SpO2 99%   BMI 17.70 kg/m    Wt Readings from Last 3 Encounters:  12/27/19 113 lb (51.3 kg)  05/13/19 113 lb 12.8 oz (51.6 kg)  01/25/19 118 lb (53.5 kg)    General: Well nourished, well developed, in no acute distress Heart: Atraumatic, normal size  Eyes: PEERLA,  EOMI  Neck: Supple, no JVD Endocrine: No thryomegaly Cardiac: Normal S1, S2; RRR; no murmurs, rubs, or gallops Lungs: Clear to auscultation bilaterally, no wheezing, rhonchi or rales  Abd: Soft, nontender, no hepatomegaly  Ext: No edema, pulses 2+ Musculoskeletal: No deformities, BUE and BLE strength normal and equal Skin: Warm and dry, no rashes   Neuro: Alert and oriented to person, place, time, and situation, CNII-XII grossly intact, no focal deficits  Psych: Normal mood and affect   ASSESSMENT:   Gurjit Trichel is a 49 y.o. female who presents for the following: 1. Palpitations     PLAN:   1. Palpitations -On and off episodes for the past 10 years.  No formal evaluation performed.  TSH checked by her primary care physician and results are pending.  She will let us know if they are abnormal.  Her EKG today demonstrates sinus bradycardia without any acute ischemic changes or evidence of prior infarction.  Her cardiovascular examination is without murmurs rubs or gallops and there is no suggestion of cardiovascular disease on my physical examination.  It is unclear if she is having ectopic beats versus an arrhythmia.  They happen so infrequently and are not happening currently.  I did inform her about the cardia mobile device which is an option to possibly detect any arrhythmic event.  She will look into this.  For now since she is not having any symptoms we will forego any evaluation at this time.  She will reach back out to [place] if she has more symptoms and we may pursue a  monitor or if she can let me know what she finds on her cardia mobile device.  I see no need for any further testing as her cardiovascular examination is benign.  Disposition: Return if symptoms worsen or fail to improve.  Medication Adjustments/Labs and Tests Ordered: Current medicines are reviewed at length with the patient today.  Concerns regarding medicines are outlined above.  Orders Placed This Encounter  Procedures  . EKG 12-Lead   No orders of the defined types were placed in this encounter.   Patient Instructions  Medication Instructions:  The current medical regimen is effective;  continue present plan and medications.  *If you need a refill on your cardiac medications before your next appointment, please call your pharmacy*   Follow-Up: At [facility], you and your health needs are our priority.  As part of our continuing mission to provide you with exceptional heart care, we have created designated Provider Care Teams.  These Care Teams include your primary Cardiologist (physician) and Advanced Practice Providers (APPs -  Physician Assistants and Nurse Practitioners) who all work together to provide you with the care you need, when you need it.  We recommend signing up for the patient portal called "MyChart".  Sign up information is provided on this After Visit Summary.  MyChart is used to connect with patients for Virtual Visits (Telemedicine).  Patients are able to view lab/test results, encounter notes, upcoming appointments, etc.  Non-urgent messages can be sent to your provider as well.   To learn more about what you can do with MyChart, go to [email or web address].    Your next appointment:   As needed  The format for your next appointment:   In Person  Provider:   Eleonore Chiquito, MD        Signed, Addison Naegeli. Audie Box, [place]  [address], [place], [place] [address] [phone]  12/27/2019 12:17  PM

## 2019-12-27 ENCOUNTER — Encounter: Payer: Self-pay | Admitting: Cardiovascular Disease

## 2019-12-27 ENCOUNTER — Other Ambulatory Visit: Payer: Self-pay

## 2019-12-27 ENCOUNTER — Ambulatory Visit (INDEPENDENT_AMBULATORY_CARE_PROVIDER_SITE_OTHER): Payer: 59 | Admitting: Cardiovascular Disease

## 2019-12-27 VITALS — BP 122/82 | HR 54 | Ht 67.0 in | Wt 113.0 lb

## 2019-12-27 DIAGNOSIS — R002 Palpitations: Secondary | ICD-10-CM

## 2019-12-27 NOTE — Patient Instructions (Signed)
Medication Instructions:  The current medical regimen is effective;  continue present plan and medications.  *If you need a refill on your cardiac medications before your next appointment, please call your pharmacy*    Follow-Up: At [facility], you and your health needs are our priority.  As part of our continuing mission to provide you with exceptional heart care, we have created designated Provider Care Teams.  These Care Teams include your primary Cardiologist (physician) and Advanced Practice Providers (APPs -  Physician Assistants and Nurse Practitioners) who all work together to provide you with the care you need, when you need it.  We recommend signing up for the patient portal called "MyChart".  Sign up information is provided on this After Visit Summary.  MyChart is used to connect with patients for Virtual Visits (Telemedicine).  Patients are able to view lab/test results, encounter notes, upcoming appointments, etc.  Non-urgent messages can be sent to your provider as well.   To learn more about what you can do with MyChart, go to [email or web address].    Your next appointment:   As needed  The format for your next appointment:   In Person  Provider:   [place]ville O'Neal, MD      

## 2020-01-28 ENCOUNTER — Encounter: Payer: Self-pay | Admitting: Obstetrics and Gynecology

## 2020-07-31 ENCOUNTER — Other Ambulatory Visit (HOSPITAL_COMMUNITY)
Admission: RE | Admit: 2020-07-31 | Discharge: 2020-07-31 | Disposition: A | Discharge status: Home / Self Care | Payer: 59 | Source: Ambulatory Visit | Attending: Obstetrics and Gynecology | Admitting: Obstetrics and Gynecology

## 2020-07-31 ENCOUNTER — Ambulatory Visit (INDEPENDENT_AMBULATORY_CARE_PROVIDER_SITE_OTHER): Payer: 59 | Admitting: Obstetrics and Gynecology

## 2020-07-31 ENCOUNTER — Encounter: Payer: Self-pay | Admitting: Obstetrics and Gynecology

## 2020-07-31 ENCOUNTER — Other Ambulatory Visit: Payer: Self-pay

## 2020-07-31 ENCOUNTER — Telehealth: Payer: Self-pay

## 2020-07-31 VITALS — BP 108/72 | HR 80 | Resp 16 | Ht 67.5 in | Wt 117.0 lb

## 2020-07-31 DIAGNOSIS — Z01419 Encounter for gynecological examination (general) (routine) without abnormal findings: Secondary | ICD-10-CM

## 2020-07-31 DIAGNOSIS — N926 Irregular menstruation, unspecified: Secondary | ICD-10-CM

## 2020-07-31 DIAGNOSIS — Z23 Encounter for immunization: Secondary | ICD-10-CM

## 2020-07-31 NOTE — Progress Notes (Signed)
49 y.o. [phone] Married Caucasian female here for annual exam.    Cycles every 3 - 6 months, getting shorter.  No hot flashes or night sweats.  Denies vaginal discomfort with intercourse.  Stopped running due to her prolapse.  She saw Dr. Zigmund Daniel and she received a pessary, but is painful for removal so she does not use it.  Her fecal incontinence is improved with not running.   Saw an integrative medical specialist.  She had blood work done last year and she was in menopause.  Low vit D.   She will do her Covid booster. Did flu vaccine.   PCP:  [name]. [name], MD   Patient's last menstrual period was 07/13/2020 (within days).           Sexually active: Yes.    The current method of family planning is condoms always.    Exercising: Yes.    walking Smoker:  no  Health Maintenance: Pap:  3 or more years ago--normal per patient History of abnormal Pap:  no MMG:  01-16-18 for known benign mass--Rt.Br.neg/Lt.Br.neg/BiRads2.  This year at [facility], normal per patient.  Colonoscopy:  01/25/19 polyps; f/u 2027 BMD:   n/a  Result  n/a TDaP:  Unsure.  At least 10 years. Gardasil:   no HIV: never Hep C: never Screening Labs:  PCP   reports that she has never smoked. She has never used smokeless tobacco. She reports current alcohol use of about 1.0 standard drink of alcohol per week. She reports that she does not use drugs.  Past Medical History:  Diagnosis Date  . Allergy   . Cancer (HCC)    Basal cell, underarm and behind ear  . Fibroid   . Rectal bleeding     Past Surgical History:  Procedure Laterality Date  . COLONOSCOPY    . DENTAL SURGERY    . DILATION AND CURETTAGE OF UTERUS     x2  . MOUTH SURGERY  1990    Current Outpatient Medications  Medication Sig Dispense Refill  . Cholecalciferol (VITAMIN D3 PO) Take 1 capsule by mouth daily. Takes 1060mcg    . Cyanocobalamin 1000 MCG TBCR Take by mouth.    . Lactobacillus Rhamnosus, GG, (CULTURELLE) CAPS Take by mouth.  Probiotic daily    . Multiple Vitamins-Minerals (MULTIVITAMIN ADULT PO) Take by mouth.    . Turmeric Curcumin 500 MG CAPS Take by mouth.     No current facility-administered medications for this visit.    Family History  Problem Relation Age of Onset  . Colon cancer Paternal Grandmother   . Breast cancer Paternal Grandmother   . Thyroid disease Mother        Hashimotos  . Cancer Mother        Thyroid CA  . Thyroid disease Father   . Congenital heart disease Maternal Grandmother   . Heart failure Maternal Grandmother   . Sudden death Neg Hx   . Hypertension Neg Hx   . Hyperlipidemia Neg Hx   . Heart attack Neg Hx   . Diabetes Neg Hx   . Colon polyps Neg Hx   . Esophageal cancer Neg Hx   . Rectal cancer Neg Hx   . Stomach cancer Neg Hx     Review of Systems  Constitutional: Negative.   HENT: Negative.   Eyes: Negative.   Respiratory: Negative.   Cardiovascular: Negative.   Gastrointestinal: Negative.   Endocrine: Negative.   Genitourinary: Negative.   Musculoskeletal: Negative.   Skin:  Negative.   Allergic/Immunologic: Negative.   Neurological: Negative.   Hematological: Negative.   Psychiatric/Behavioral: Negative.     Exam:   BP 108/72 (BP Location: Right Arm, Patient Position: Sitting, Cuff Size: Normal)   Pulse 80   Resp 16   Ht 5' 7.5" (1.715 m)   Wt 117 lb (53.1 kg)   LMP 07/13/2020 (Within Days)   BMI 18.05 kg/m     General appearance: alert, cooperative and appears stated age Head: normocephalic, without obvious abnormality, atraumatic Neck: no adenopathy, supple, symmetrical, trachea midline and thyroid normal to inspection and palpation Lungs: clear to auscultation bilaterally Breasts: normal appearance, no masses or tenderness, No nipple retraction or dimpling, No nipple discharge or bleeding, No axillary adenopathy Heart: regular rate and rhythm Abdomen: soft, non-tender; no masses, no organomegaly Extremities: extremities normal, atraumatic, no  cyanosis or edema Skin: skin color, texture, turgor normal. No rashes or lesions Lymph nodes: cervical, supraclavicular, and axillary nodes normal. Neurologic: grossly normal  Pelvic: External genitalia:  no lesions              No abnormal inguinal nodes palpated.              Urethra:  normal appearing urethra with no masses, tenderness or lesions              Bartholins and Skenes: normal                 Vagina: normal appearing vagina with normal color and discharge, no lesions              Cervix: no lesions              Pap taken: Yes.   Bimanual Exam:  Uterus:  normal size, contour, position, consistency, mobility, non-tender              Adnexa: no mass, fullness, tenderness              Rectal exam: Yes.  .  Confirms.              Anus:  normal sphincter tone, no lesions  Chaperone was present for exam.  Assessment:   Well woman visit with normal exam. Hx fecal incontinence and prolapse.  Normal exam today.  Irregular menses.  Likely perimenopausal.  Plan: Mammogram screening discussed.  Will get report from [place].  Self breast awareness reviewed. Pap and HR HPV as above. Guidelines for Calcium, Vitamin D, regular exercise program including cardiovascular and weight bearing exercise. TDap.  Check FSH and E2.  Return with pessary to assist with care and check the size.  I recommended use of a water based lubricant to make removal easier.   Follow up annually and prn.

## 2020-07-31 NOTE — Patient Instructions (Signed)

## 2020-07-31 NOTE — Telephone Encounter (Signed)
Left message for pt to return call to triage RN. 

## 2020-07-31 NOTE — Telephone Encounter (Signed)
Patient need an appointment in January for pessary fitting. Sending to triage to assist with scheduling.

## 2020-08-01 LAB — FOLLICLE STIMULATING HORMONE: FSH: 145 m[IU]/mL

## 2020-08-01 LAB — ESTRADIOL: Estradiol: 17 pg/mL

## 2020-08-03 LAB — CYTOLOGY - PAP
Comment: NEGATIVE
Diagnosis: NEGATIVE
High risk HPV: NEGATIVE

## 2020-08-03 NOTE — Telephone Encounter (Signed)
Spoke with pt. Pt calling to have pessary fitting. Pt states has been out "for awhile". Pessary was originally placed by Dr Zigmund Daniel. Pt states was uncomfortable and unable to run or move freely. Denies any other concerns. Pt advised to have OV for pessary fitting and care. Reviewed notes from [facility] per Dr Quincy Simmonds.  Pt scheduled with Dr Quincy Simmonds on 12/28 at 4pm. Pt agreeable to date and time of appt. Pt thankful for appt and callback.  Routing to Dr Quincy Simmonds for review Encounter closed  Per reviewed notes on [facility] 07/31/20: Return with pessary to assist with care and check the size.  I recommended use of a water based lubricant to make removal easier.

## 2020-08-11 ENCOUNTER — Other Ambulatory Visit: Payer: Self-pay

## 2020-08-11 ENCOUNTER — Ambulatory Visit (INDEPENDENT_AMBULATORY_CARE_PROVIDER_SITE_OTHER): Payer: 59 | Admitting: Obstetrics and Gynecology

## 2020-08-11 ENCOUNTER — Encounter: Payer: Self-pay | Admitting: Obstetrics and Gynecology

## 2020-08-11 VITALS — BP 100/62 | HR 63 | Ht 67.5 in | Wt 117.0 lb

## 2020-08-11 DIAGNOSIS — Z4689 Encounter for fitting and adjustment of other specified devices: Secondary | ICD-10-CM

## 2020-08-11 DIAGNOSIS — N812 Incomplete uterovaginal prolapse: Secondary | ICD-10-CM | POA: Diagnosis not present

## 2020-08-11 NOTE — Progress Notes (Signed)
GYNECOLOGY  VISIT   HPI: 49 y.o.   Married  Caucasian  female   [phone] with Patient's last menstrual period was 07/13/2020 (within days).   here for pessary check.   She has a number 3 ring with support given to her by Dr. Lavella Hammock.  It is painful to remove but it does hold her prolapse well.  She feels she needs instruction about pessary use if she is going to try to use this.  She did pelvic floor therapy at [facility].   She sees an integrative medicine doctor in [place].   GYNECOLOGIC HISTORY: Patient's last menstrual period was 07/13/2020 (within days). Contraception: condoms always Menopausal hormone therapy:  None Last mammogram: 01-16-18 for known benign mass--Rt.Br.neg/Lt.Br.neg/BiRads2.  This year at [facility], normal per patient.  Last pap smear:  07-31-20 Neg:Neg HR HPV, 3 or more years ago--normal per patient        OB History    Gravida  6   Para  3   Term      Preterm      AB  3   Living  3     SAB  3   IAB      Ectopic      Multiple      Live Births                 Patient Active Problem List   Diagnosis Date Noted  . Cervical disc disorder with radiculopathy of cervical region 03/09/2016  . Benign hypermobility syndrome 03/09/2016  . Achilles tendon pain 06/18/2015  . Abnormality of gait 06/18/2015  . Gluteus medius or minimus syndrome 06/18/2015  . Right ankle pain 07/17/2013  . DYSPNEA/SHORTNESS OF BREATH 01/25/2008  . WEIGHT LOSS, ABNORMAL 10/24/2007  . SHOULDER PAIN, RIGHT 07/05/2007  . KNEE PAIN, RIGHT 07/05/2007  . DISORDER, ENDOCRINE NEC 03/29/2007    Past Medical History:  Diagnosis Date  . Allergy   . Cancer (HCC)    Basal cell, underarm and behind ear  . Fibroid   . Rectal bleeding     Past Surgical History:  Procedure Laterality Date  . COLONOSCOPY    . DENTAL SURGERY    . DILATION AND CURETTAGE OF UTERUS     x2  . MOUTH SURGERY  1990    Current Outpatient Medications  Medication Sig  Dispense Refill  . Cholecalciferol (VITAMIN D3 PO) Take 1 capsule by mouth daily. Takes    . Cyanocobalamin 1000 MCG TBCR Take by mouth.    . Lactobacillus Rhamnosus, GG, (CULTURELLE) CAPS Take by mouth. Probiotic daily    . Multiple Vitamins-Minerals (MULTIVITAMIN ADULT PO) Take by mouth.    . Turmeric Curcumin 500 MG CAPS Take by mouth.     No current facility-administered medications for this visit.     ALLERGIES: Patient has no known allergies.  Family History  Problem Relation Age of Onset  . Colon cancer Paternal Grandmother   . Breast cancer Paternal Grandmother   . Thyroid disease Mother        Hashimotos  . Cancer Mother        Thyroid CA  . Thyroid disease Father   . Congenital heart disease Maternal Grandmother   . Heart failure Maternal Grandmother   . Sudden death Neg Hx   . Hypertension Neg Hx   . Hyperlipidemia Neg Hx   . Heart attack Neg Hx   . Diabetes Neg Hx   . Colon polyps Neg Hx   .  Esophageal cancer Neg Hx   . Rectal cancer Neg Hx   . Stomach cancer Neg Hx     Social History   Socioeconomic History  . Marital status: Married    Spouse name: Not on file  . Number of children: 3  . Years of education: Not on file  . Highest education level: Not on file  Occupational History  . Occupation: stay at home mom  Tobacco Use  . Smoking status: Never Smoker  . Smokeless tobacco: Never Used  Vaping Use  . Vaping Use: Never used  Substance and Sexual Activity  . Alcohol use: Yes    Alcohol/week: 1.0 standard drink    Types: 1 Glasses of wine per week    Comment: socially   . Drug use: Never  . Sexual activity: Yes    Birth control/protection: Condom    Comment: condom everytime  Other Topics Concern  . Not on file  Social History Narrative  . Not on file   Social Determinants of Health   Financial Resource Strain: Not on file  Food Insecurity: Not on file  Transportation Needs: Not on file  Physical Activity: Not on file  Stress:  Not on file  Social Connections: Not on file  Intimate Partner Violence: Not on file    Review of Systems  All other systems reviewed and are negative.   PHYSICAL EXAMINATION:    BP 100/62   Pulse 63   Ht 5' 7.5" (1.715 m)   Wt 117 lb (53.1 kg)   LMP 07/13/2020 (Within Days)   SpO2 99%   BMI 18.05 kg/m     General appearance: alert, cooperative and appears stated age    Pelvic: External genitalia:  no lesions              Urethra:  Normal.   Patient's #3 ring with support placed.  She is able to do maneuvers and maintain the pessary.  It is comfortable while in place.  Pessary removed and cleansed and returned to the patient.   #2 ring with support demo pessary placed.  She is able to do maneuvers and maintain the pessary.  It is comfortable while in place.  The bladder prolapses in front of the pessary.  Pessary removed.    Chaperone was present for exam.  ASSESSMENT  Incomplete uterovaginal prolapse. Pessary maintenance. She has an appropriate pessary size.   PLAN  Pessary instruction and care reviewed with patient. Will request 2021 mammogram report from [employer].  She wants to try vaginal estrogen cream.  She would prefer estradiol cream.  We reviewed used of a water based lubricant for placement and removal of the pessary.  Pessary care and instructions reviewed.  Fu for yearly visit and prn.   33 min  total time was spent for this patient encounter, including preparation, face-to-face counseling with the patient, coordination of care, and documentation of the encounter.

## 2020-08-11 NOTE — Patient Instructions (Signed)
PESSARY CARE  You may take the pessary out once or twice a week at bedtime and leave it out overnight.  You may be able to leave it in for one month at a time, and just remove it for intercourse.   Clean the pessary with soap and water only.   If you develop green foul smelling discharge, leave the pessary out for 2 - 3 days.   Use KY jelly or any other personal lubricant that is water based to help with placement and removal.   Wear a sanitary pad until you are sure you understand if you have urinary incontinence with the pessary in place.   Wear biking type shorts with crotch support for exercise until you know that the pessary will not come out easily with maneuvers causing increases in abdominal pressure.  The pessary needs to be removed for sexual activity.   Look before you flush the toilet to be sure the pessary has not come out with straining on the toilet!  Please call for any pain or vaginal bleeding.

## 2020-08-13 ENCOUNTER — Telehealth: Payer: Self-pay | Admitting: Obstetrics and Gynecology

## 2020-08-13 NOTE — Telephone Encounter (Signed)
Please get a copy of patient's mammogram from [place].   I need this to be able to give her a prescription for vaginal estradiol cream.

## 2020-08-18 NOTE — Telephone Encounter (Signed)
Call placed to [facility], spoke with [facility]. Requested copy of most recent MMG.  Bilateral Dx MMG completed on 01/28/2020.  Copy of report to be faxed to [phone].

## 2020-08-19 NOTE — Telephone Encounter (Signed)
MMG report to Dr. Edward Jolly on 08/18/20.

## 2020-08-24 MED ORDER — ESTRADIOL 0.1 MG/GM VA CREA: TOPICAL_CREAM | VAGINAL | 2 refills | Status: DC

## 2020-08-24 NOTE — Telephone Encounter (Signed)
Encounter closed

## 2020-08-24 NOTE — Telephone Encounter (Signed)
Mammogram dx bilateral and right breast ultrasound reviewed from [place], 01/28/20.  Benign 2.8 cm simple cyst noted in the right breast.  No evidence of malignancy. Ok to return to routine screening.   Reports to be scanned in to Epic.   [place] for vaginal estradiol cream 0.01%, place 1/2 gram pv at hs for 2 weeks.  Then place 1/2 gram pv at hs 2 - 3 times per week.  Disp:  42.5 gram RF:  two  Cc - Glorianne Manchester

## 2020-08-24 NOTE — Telephone Encounter (Signed)
Rx sent. Patient informed. 

## 2020-11-18 ENCOUNTER — Ambulatory Visit: Payer: 59 | Admitting: Obstetrics and Gynecology

## 2021-07-12 ENCOUNTER — Ambulatory Visit: Payer: 59 | Admitting: Family Medicine

## 2021-08-06 ENCOUNTER — Ambulatory Visit (INDEPENDENT_AMBULATORY_CARE_PROVIDER_SITE_OTHER): Payer: 59

## 2021-08-06 ENCOUNTER — Encounter: Payer: Self-pay | Admitting: Cardiovascular Disease

## 2021-08-06 DIAGNOSIS — R002 Palpitations: Secondary | ICD-10-CM

## 2021-08-06 NOTE — Telephone Encounter (Signed)
Returned call to pt and she states that this month epically the last week and a half she has been experiencing intermittent racing heart rate(118, 142-yesterday, no symptoms today), pain under the left arm. She states that she thinks that this may be stress and anxiety. She states that she can feel the heart racing "in her throat" this lasts about 2-3 seconds and sometimes this will be relieved by burping. This has only happened 2 times this month, but "seems to be daily" for the last "couple days". She states that she recently received abnormal cholesterol results and thinks this may be causing anxiety. She states that her PCP told her that "these feeling of anxiety/stress is normal" pt does not think so.  She recently received her cholesterol results total:261 TG 93 HDL 77 LDL 165 non HDL 184 (this is also causing stress to her-she has never had high numbers in the past). PCP([facility]) does not want to rx cholesterol medication. She thinks that she needs to start some. She states that she does not eat meat, she gets her protein from beans, hemp seeds, lentils and other beans like that. She is leaving Monday to drive her daughter to [place], wants to make sure it is ok to do so.  Any suggestions? Please advise.

## 2021-08-06 NOTE — Progress Notes (Unsigned)
Enrolled for [employer] to mail a ZIO XT long term holter monitor to the patients address on file.  

## 2021-08-17 DIAGNOSIS — R002 Palpitations: Secondary | ICD-10-CM | POA: Diagnosis not present

## 2021-08-23 ENCOUNTER — Ambulatory Visit (INDEPENDENT_AMBULATORY_CARE_PROVIDER_SITE_OTHER): Payer: 59 | Admitting: Obstetrics and Gynecology

## 2021-08-23 ENCOUNTER — Encounter: Payer: Self-pay | Admitting: Obstetrics and Gynecology

## 2021-08-23 ENCOUNTER — Other Ambulatory Visit: Payer: Self-pay

## 2021-08-23 VITALS — BP 102/76 | HR 74 | Ht 67.5 in | Wt 118.0 lb

## 2021-08-23 DIAGNOSIS — N926 Irregular menstruation, unspecified: Secondary | ICD-10-CM

## 2021-08-23 DIAGNOSIS — R922 Inconclusive mammogram: Secondary | ICD-10-CM | POA: Diagnosis not present

## 2021-08-23 MED ORDER — MEDROXYPROGESTERONE ACETATE 10 MG PO TABS: 10.0000 mg | ORAL_TABLET | Freq: Every day | ORAL | 0 refills | Status: DC

## 2021-08-23 NOTE — Progress Notes (Signed)
GYNECOLOGY  VISIT   HPI: 51 y.o.   Married  Caucasian  female   [phone] with No LMP recorded. (Menstrual status: Perimenopausal).   here for irregular bleeding x 3 weeks. Bleeding since 08/06/21.  Bleeding can be light or heavier.  At worst, uses a thin pad per day. No pain.   Has a pessary for incomplete uterovaginal prolapse.  Last time she used this was Jan or Feb 2022.   Hx fibroid around the time of childbearing.   It was not removed.   Had 3 dilation and curettage for miscarriages.   Not sexually active for one year.   Took steroids for a bee sting in October or November.  No hot flashes.   Patient is wearing a heart monitor for cardiology due to palpitations. Symptoms started in December.   Patient states had spotting 11-end-22, 10/2020 and began bleeding like a cycle 3 weeks ago and hasn't stopped.  Some family stress with a son who moved.   GYNECOLOGIC HISTORY: No LMP recorded. (Menstrual status: Perimenopausal). Contraception:  condoms Menopausal hormone therapy:  none Last mammogram:  04-17-21 MRI/Neg/BiRads1/[facility].  She chooses yearly fast MRI of the breast and not mammography.  She is most comfortable with MRI screening alone due to false negatives of mammograms. Last pap smear:   07-31-20 Neg:Neg HR HPV        OB History     Gravida  6   Para  3   Term      Preterm      AB  3   Living  3      SAB  3   IAB      Ectopic      Multiple      Live Births                 Patient Active Problem List   Diagnosis Date Noted   Cervical disc disorder with radiculopathy of cervical region 03/09/2016   Benign hypermobility syndrome 03/09/2016   Achilles tendon pain 06/18/2015   Abnormality of gait 06/18/2015   Gluteus medius or minimus syndrome 06/18/2015   Right ankle pain 07/17/2013   DYSPNEA/SHORTNESS OF BREATH 01/25/2008   WEIGHT LOSS, ABNORMAL 10/24/2007   SHOULDER PAIN, RIGHT 07/05/2007   KNEE PAIN, RIGHT 07/05/2007   DISORDER,  ENDOCRINE NEC 03/29/2007    Past Medical History:  Diagnosis Date   Allergy    Cancer ([place])    Basal cell, underarm and behind ear   Fibroid    Rectal bleeding     Past Surgical History:  Procedure Laterality Date   COLONOSCOPY     DENTAL SURGERY     DILATION AND CURETTAGE OF UTERUS     x2   MOUTH SURGERY  1990    Current Outpatient Medications  Medication Sig Dispense Refill   Cholecalciferol (VITAMIN D3 PO) Take 1 capsule by mouth daily. Takes 1073mcg     Cyanocobalamin 1000 MCG TBCR Take by mouth.     famotidine (PEPCID) 40 MG tablet Take 40 mg by mouth daily.     hydrOXYzine (ATARAX) 25 MG tablet Take 25 mg by mouth 3 (three) times daily as needed.     Lactobacillus Rhamnosus, GG, (CULTURELLE) CAPS Take by mouth. Probiotic daily     Multiple Vitamins-Minerals (MULTIVITAMIN ADULT PO) Take by mouth.     Turmeric Curcumin 500 MG CAPS Take by mouth.     No current facility-administered medications for this visit.     ALLERGIES: Bee  venom  Family History  Problem Relation Age of Onset   Colon cancer Paternal Grandmother    Breast cancer Paternal Grandmother    Thyroid disease Mother        30   Cancer Mother        Thyroid CA   Thyroid disease Father    Congenital heart disease Maternal Grandmother    Heart failure Maternal Grandmother    Sudden death Neg Hx    Hypertension Neg Hx    Hyperlipidemia Neg Hx    Heart attack Neg Hx    Diabetes Neg Hx    Colon polyps Neg Hx    Esophageal cancer Neg Hx    Rectal cancer Neg Hx    Stomach cancer Neg Hx     Social History   Socioeconomic History   Marital status: Married    Spouse name: Not on file   Number of children: 3   Years of education: Not on file   Highest education level: Not on file  Occupational History   Occupation: stay at home mom  Tobacco Use   Smoking status: Never   Smokeless tobacco: Never  Vaping Use   Vaping Use: Never used  Substance and Sexual Activity   Alcohol use: Yes     Alcohol/week: 1.0 standard drink    Types: 1 Glasses of wine per week    Comment: socially    Drug use: Never   Sexual activity: Yes    Birth control/protection: Condom    Comment: condom everytime  Other Topics Concern   Not on file  Social History Narrative   Not on file   Social Determinants of Health   Financial Resource Strain: Not on file  Food Insecurity: Not on file  Transportation Needs: Not on file  Physical Activity: Not on file  Stress: Not on file  Social Connections: Not on file  Intimate Partner Violence: Not on file    Review of Systems  Genitourinary:  Positive for vaginal bleeding.  All other systems reviewed and are negative.  PHYSICAL EXAMINATION:    BP 102/76    Pulse 74    Ht 5' 7.5" (1.715 m)    Wt 118 lb (53.5 kg)    SpO2 99%    BMI 18.21 kg/m     General appearance: alert, cooperative and appears stated age   Pelvic: External genitalia:  no lesions              Urethra:  normal appearing urethra with no masses, tenderness or lesions              Bartholins and Skenes: normal                 Vagina: normal appearing vagina with normal color and discharge, no lesions              Cervix: no lesions. Blood in the vaginal and at os.  No active bleeding.                 Bimanual Exam:  Uterus:  normal size, contour, position, consistency, mobility, non-tender              Adnexa: no mass, fullness, tenderness      Chaperone was present for exam: Estill Bamberg, CMA  ASSESSMENT  Abnormal menses.  Likely anovulation.  Cat C density of breast tissue on mammogram 01/28/20. Heterogeneously glandular breasts on MRI 04/27/21.   PLAN  We discussed possible reasons for the abnormal  uterine bleeding - anovulation, polyps, fibroids.  Will do a course of Provera 10 mg x 10 days.  Side effects reviewed and expected effect reviewed.  She will return if the irregular bleeding persists or returns.  Would then do [place].  We reviewed guidelines in Up to Date regarding  MRI as an adjunct to mammogram screening for breast cancer.  She is most comfortable with doing only MRI of the breast and has reviewed the literature as well.  FU prn.    An After Visit Summary was printed and given to the patient.  30 min  total time was spent for this patient encounter, including preparation, face-to-face counseling with the patient, coordination of care, and documentation of the encounter.

## 2021-09-03 NOTE — Progress Notes (Signed)
Cardiology Office Note:   Date:  09/07/2021  NAME:  Stacy Roberts    MRN: [id number] DOB:  [birth year]   PCP:  Hayden Rasmussen, MD  Cardiologist:  Evalina Field, MD  Electrophysiologist:  None   Referring MD: Hayden Rasmussen, MD   Chief Complaint  Patient presents with   Follow-up        History of Present Illness:   Stacy Roberts is a 51 y.o. female with a hx of palpitations presents for follow-up. Evaluated in 2021 for palpitations but had no further symptoms when she was evaluated.  We deferred a monitor and she bought a cardia mobile.  Called our office over December with complaints of palpitations.  Completed formal monitor which shows no evidence of arrhythmia. She reports she developed palpitations and rapid heartbeat sensation in December and January.  Symptoms would occur when she woke up.  She had a cardia mobile device that showed sinus tachycardia at times.  I did review the strips.  No evidence of arrhythmia.  She completed a heart monitor that was normal.  She reports she has started menopause symptoms.  Symptoms did occur around this time.  She reports having a heavy menstrual bleeding episode and symptoms resolved.  She does report a stressful time as well in December and January.  She reports one of her sons moved to [facility].  The move was quite stressful.  The holidays were also stressful.  She seems to be doing much better and symptoms have resolved.  She denies any chest pain or significant shortness of breath.  She is not exercising that much.  She did get her cholesterol checked by her primary care physician.  LDL cholesterol 165. 10-year ASCVD risk score 0.9%.  Overall symptoms have resolved.  Doing well.  Cardiovascular examination is normal.  Problem List HLD -T chol 261, HDL 77, LDL 165, TG 93  Past Medical History: Past Medical History:  Diagnosis Date   Allergy    Cancer ([place])    Basal cell, underarm and behind ear   Fibroid    Rectal bleeding      Past Surgical History: Past Surgical History:  Procedure Laterality Date   COLONOSCOPY     DENTAL SURGERY     DILATION AND CURETTAGE OF UTERUS     x2   MOUTH SURGERY  1990    Current Medications: No outpatient medications have been marked as taking for the 09/07/21 encounter (Office Visit) with O'Neal, Cassie Freer, MD.     Allergies:    Bee venom   Social History: Social History   Socioeconomic History   Marital status: Married    Spouse name: Not on file   Number of children: 3   Years of education: Not on file   Highest education level: Not on file  Occupational History   Occupation: stay at home mom  Tobacco Use   Smoking status: Never   Smokeless tobacco: Never  Vaping Use   Vaping Use: Never used  Substance and Sexual Activity   Alcohol use: Yes    Alcohol/week: 1.0 standard drink    Types: 1 Glasses of wine per week    Comment: socially    Drug use: Never   Sexual activity: Yes    Birth control/protection: Condom    Comment: condom everytime  Other Topics Concern   Not on file  Social History Narrative   Not on file   Social Determinants of Radio broadcast assistant  Strain: Not on file  Food Insecurity: Not on file  Transportation Needs: Not on file  Physical Activity: Not on file  Stress: Not on file  Social Connections: Not on file     Family History: The patient's family history includes Breast cancer in her paternal grandmother; Cancer in her mother; Colon cancer in her paternal grandmother; Congenital heart disease in her maternal grandmother; Heart failure in her maternal grandmother; Thyroid disease in her father and mother. There is no history of Sudden death, Hypertension, Hyperlipidemia, Heart attack, Diabetes, Colon polyps, Esophageal cancer, Rectal cancer, or Stomach cancer.  ROS:   All other ROS reviewed and negative. Pertinent positives noted in the HPI.     EKGs/Labs/Other Studies Reviewed:   The following studies were  personally reviewed by me today:  EKG:  EKG is ordered today.  The ekg ordered today demonstrates normal sinus rhythm with sinus arrhythmia, heart rate 65, and was personally reviewed by me.   Recent Labs: No results found for requested labs within last 8760 hours.   Recent Lipid Panel    Component Value Date/Time   CHOL 176 09/22/2006 1133   TRIG 72 09/22/2006 1133   HDL 54.3 09/22/2006 1133   CHOLHDL 3.2 CALC 09/22/2006 1133   VLDL 14 09/22/2006 1133   LDLCALC 107 (H) 09/22/2006 1133    Physical Exam:   VS:  BP 110/70 (BP Location: Left Arm, Patient Position: Sitting, Cuff Size: Normal)    Pulse 65    Ht 5' 7.5" (1.715 m)    Wt 119 lb 6.4 oz (54.2 kg)    BMI 18.42 kg/m    Wt Readings from Last 3 Encounters:  09/07/21 119 lb 6.4 oz (54.2 kg)  08/23/21 118 lb (53.5 kg)  08/11/20 117 lb (53.1 kg)    General: Well nourished, well developed, in no acute distress Head: Atraumatic, normal size  Eyes: PEERLA, EOMI  Neck: Supple, no JVD Endocrine: No thryomegaly Cardiac: Normal S1, S2; RRR; no murmurs, rubs, or gallops Lungs: Clear to auscultation bilaterally, no wheezing, rhonchi or rales  Abd: Soft, nontender, no hepatomegaly  Ext: No edema, pulses 2+ Musculoskeletal: No deformities, BUE and BLE strength normal and equal Skin: Warm and dry, no rashes   Neuro: Alert and oriented to person, place, time, and situation, CNII-XII grossly intact, no focal deficits  Psych: Normal mood and affect   ASSESSMENT:   Stacy Roberts is a 51 y.o. female who presents for the following: 1. Palpitations   2. Mixed hyperlipidemia     PLAN:   1. Palpitations -Symptoms of palpitations in December and early January.  Monitor shows no arrhythmia.  Cardia mobile device shows no evidence of arrhythmia either.  Symptoms coincided with starting menopause.  I suspect this was a hormonal issue.  She reports thyroid test by her primary care physician were normal recently.  She denies any further  symptoms.  I recommended watchful waiting.  She will continue with adequate hydration as well as proper sleep.  She also work on stress reduction strategies and exercise.  2. Mixed hyperlipidemia -LDL cholesterol 165.  10-year ASCVD risk or is low 0.9%.  No strong family history of heart disease.  She is not wanting to take medications.  We discussed calcium score for further restratification.  She is okay to do this.   Disposition: Return if symptoms worsen or fail to improve.  Medication Adjustments/Labs and Tests Ordered: Current medicines are reviewed at length with the patient today.  Concerns regarding medicines  are outlined above.  Orders Placed This Encounter  Procedures   CT CARDIAC SCORING (SELF PAY ONLY)   EKG 12-Lead   No orders of the defined types were placed in this encounter.   Patient Instructions  Medication Instructions:  The current medical regimen is effective;  continue present plan and medications.  *If you need a refill on your cardiac medications before your next appointment, please call your pharmacy*   Testing/Procedures: CALCIUM SCORE   Follow-Up: At [facility], you and your health needs are our priority.  As part of our continuing mission to provide you with exceptional heart care, we have created designated Provider Care Teams.  These Care Teams include your primary Cardiologist (physician) and Advanced Practice Providers (APPs -  Physician Assistants and Nurse Practitioners) who all work together to provide you with the care you need, when you need it.  We recommend signing up for the patient portal called "MyChart".  Sign up information is provided on this After Visit Summary.  MyChart is used to connect with patients for Virtual Visits (Telemedicine).  Patients are able to view lab/test results, encounter notes, upcoming appointments, etc.  Non-urgent messages can be sent to your provider as well.   To learn more about what you can do with MyChart,  go to [email or web address].    Your next appointment:   As needed  The format for your next appointment:   In Person  Provider:   Evalina Field, MD       Signed, Addison Naegeli. Audie Box, MD, [facility]  [address] [place], [place] [address] [phone]  09/07/2021 9:42 AM

## 2021-09-07 ENCOUNTER — Ambulatory Visit (INDEPENDENT_AMBULATORY_CARE_PROVIDER_SITE_OTHER): Payer: 59 | Admitting: Cardiovascular Disease

## 2021-09-07 ENCOUNTER — Encounter (HOSPITAL_BASED_OUTPATIENT_CLINIC_OR_DEPARTMENT_OTHER): Payer: Self-pay | Admitting: Cardiovascular Disease

## 2021-09-07 ENCOUNTER — Other Ambulatory Visit: Payer: Self-pay

## 2021-09-07 VITALS — BP 110/70 | HR 65 | Ht 67.5 in | Wt 119.4 lb

## 2021-09-07 DIAGNOSIS — E782 Mixed hyperlipidemia: Secondary | ICD-10-CM | POA: Diagnosis not present

## 2021-09-07 DIAGNOSIS — R002 Palpitations: Secondary | ICD-10-CM

## 2021-09-07 NOTE — Patient Instructions (Signed)
Medication Instructions:  The current medical regimen is effective;  continue present plan and medications.  *If you need a refill on your cardiac medications before your next appointment, please call your pharmacy*   Testing/Procedures: CALCIUM SCORE   Follow-Up: At [facility], you and your health needs are our priority.  As part of our continuing mission to provide you with exceptional heart care, we have created designated Provider Care Teams.  These Care Teams include your primary Cardiologist (physician) and Advanced Practice Providers (APPs -  Physician Assistants and Nurse Practitioners) who all work together to provide you with the care you need, when you need it.  We recommend signing up for the patient portal called "MyChart".  Sign up information is provided on this After Visit Summary.  MyChart is used to connect with patients for Virtual Visits (Telemedicine).  Patients are able to view lab/test results, encounter notes, upcoming appointments, etc.  Non-urgent messages can be sent to your provider as well.   To learn more about what you can do with MyChart, go to [email or web address].    Your next appointment:   As needed  The format for your next appointment:   In Person  Provider:   Evalina Field, MD

## 2021-09-10 ENCOUNTER — Ambulatory Visit: Payer: 59 | Admitting: Cardiovascular Disease

## 2021-09-14 ENCOUNTER — Ambulatory Visit (HOSPITAL_BASED_OUTPATIENT_CLINIC_OR_DEPARTMENT_OTHER)
Admission: RE | Admit: 2021-09-14 | Discharge: 2021-09-14 | Disposition: A | Discharge status: Home / Self Care | Payer: 59 | Source: Ambulatory Visit | Attending: Cardiovascular Disease | Admitting: Cardiovascular Disease

## 2021-09-14 ENCOUNTER — Ambulatory Visit (HOSPITAL_BASED_OUTPATIENT_CLINIC_OR_DEPARTMENT_OTHER): Payer: 59

## 2021-09-14 ENCOUNTER — Encounter (HOSPITAL_BASED_OUTPATIENT_CLINIC_OR_DEPARTMENT_OTHER): Payer: Self-pay

## 2021-09-14 ENCOUNTER — Other Ambulatory Visit: Payer: Self-pay

## 2021-09-14 DIAGNOSIS — E782 Mixed hyperlipidemia: Secondary | ICD-10-CM | POA: Insufficient documentation

## 2021-12-03 ENCOUNTER — Ambulatory Visit: Payer: 59 | Admitting: Cardiovascular Disease

## 2022-01-21 ENCOUNTER — Encounter (HOSPITAL_BASED_OUTPATIENT_CLINIC_OR_DEPARTMENT_OTHER): Payer: Self-pay | Admitting: Cardiovascular Disease

## 2022-10-20 ENCOUNTER — Other Ambulatory Visit: Payer: Self-pay

## 2022-12-26 ENCOUNTER — Ambulatory Visit: Payer: 59 | Admitting: Family Medicine

## 2023-05-15 ENCOUNTER — Ambulatory Visit (INDEPENDENT_AMBULATORY_CARE_PROVIDER_SITE_OTHER): Payer: Managed Care, Other (non HMO) | Admitting: Family Medicine

## 2023-05-15 VITALS — BP 110/74 | Ht 67.0 in | Wt 130.0 lb

## 2023-05-15 DIAGNOSIS — M7662 Achilles tendinitis, left leg: Secondary | ICD-10-CM

## 2023-05-15 DIAGNOSIS — M7661 Achilles tendinitis, right leg: Secondary | ICD-10-CM

## 2023-05-15 MED ORDER — NITROGLYCERIN 0.2 MG/HR TD PT24: MEDICATED_PATCH | TRANSDERMAL | 1 refills | Status: AC

## 2023-05-15 NOTE — Progress Notes (Signed)
PCP: Dois Davenport, MD  Subjective:   HPI: Patient is a 52 y.o. female here for bilateral achilles pain.  Pain in bilateral Achilles has been ongoing for years, intermittently improves with resumption of her calf strengthening with her home PT exercises.  Over the past year this has been worsening to the point where she is having pain that limits her movement and activity when she gets up after long periods of resting and when she gets up in the morning.  Denies pain over the plantar aspect of her foot. Has tried Tylenol and Ibuprofen but does not want to take every day.  Has previously seen Dr. Darrick Penna and Dr. Pearletha Forge for peroneal tendinitis and bilateral Achilles tendinitis years ago. Was fitted with orthotics which helped her gait, but not her achilles pain.  Past Medical History:  Diagnosis Date   Allergy    Cancer ([facility])    Basal cell, underarm and behind ear   Fibroid    Rectal bleeding     Current Outpatient Medications on File Prior to Visit  Medication Sig Dispense Refill   Cholecalciferol (VITAMIN D3 PO) Take 1 capsule by mouth daily. Takes     Cyanocobalamin 1000 MCG TBCR Take by mouth.     famotidine (PEPCID) 40 MG tablet Take 40 mg by mouth daily.     hydrOXYzine (ATARAX) 25 MG tablet Take 25 mg by mouth 3 (three) times daily as needed.     Lactobacillus Rhamnosus, GG, (CULTURELLE) CAPS Take by mouth. Probiotic daily     Multiple Vitamins-Minerals (MULTIVITAMIN ADULT PO) Take by mouth.     Turmeric Curcumin 500 MG CAPS Take by mouth.     No current facility-administered medications on file prior to visit.    Past Surgical History:  Procedure Laterality Date   COLONOSCOPY     DENTAL SURGERY     DILATION AND CURETTAGE OF UTERUS     x2   MOUTH SURGERY  1990    Allergies  Allergen Reactions   Bee Venom Hives, Itching, Rash and Swelling    BP 110/74   Ht 5\' 7"  (1.702 m)   Wt 130 lb (59 kg)   BMI 20.36 kg/m       No data to display               No data to display              Objective:  Physical Exam:  Gen: NAD, comfortable in exam room  Bilateral Ankle Inspection: No obvious deformity, erythema, swelling, ecchymoses Palpation: Mildly tender to palpation at insertion of Achilles bilaterally, NTTP at medial and lateral malleoli, base of fifth metatarsal, navicular, plantar fascia ROM: Full range of motion in both feet in all directions Special Tests: Negative calcaneal squeeze bilaterally Strength: 5/5 all directions on both feet Additional: Pain is not reproducible bilaterally with resisted plantarflexion    Assessment & Plan:   Assessment & Plan Insertional tendinopathy of both Achilles tendons Bilateral heel pain secondary to insertional tendinopathy of both Achilles tendons.  Unfortunately patient's clinical course will likely require extended therapy due to insertional tendinopathy as she has had worsening symptoms despite regular Achilles home exercises.  Discussed continuing home exercises. Will add nitroglycerin patches, daily icing, and heel lift bilaterally.  Counseled on avoiding activities that stress Achilles tendon.  If not improving in 6 weeks, consider shockwave therapy.  Some or all of this note was dictated use Dragon software, there may be unintentional errors present.  Celine Mans, MD, PGY-2 [facility] 11:12 AM 05/15/2023

## 2023-05-15 NOTE — Patient Instructions (Signed)
You have Achilles Tendinopathy Ibuprofen 600mg  three times a day with food OR aleve 2 tabs twice a day with food for pain and inflammation. Nitroglycerin patches - 1/4th patch to affected heel, change daily.  After 1 week you can start placing 1/4th patch on both heels and change daily. Do home exercises daily as directed. Ice bucket 10-15 minutes at end of day - can ice 3-4 times a day. Avoid uneven ground, hills as much as possible. Heel lifts in shoes or shoes with a natural heel lift. Consider shockwave therapy if not improving. Follow up in 6 weeks.

## 2023-07-18 ENCOUNTER — Encounter: Payer: Self-pay | Admitting: Nurse Practitioner

## 2023-07-18 ENCOUNTER — Ambulatory Visit: Payer: Managed Care, Other (non HMO) | Attending: Nurse Practitioner | Admitting: Nurse Practitioner

## 2023-07-18 VITALS — BP 108/72 | HR 68 | Ht 67.0 in | Wt 126.0 lb

## 2023-07-18 DIAGNOSIS — R002 Palpitations: Secondary | ICD-10-CM | POA: Diagnosis not present

## 2023-07-18 DIAGNOSIS — E782 Mixed hyperlipidemia: Secondary | ICD-10-CM | POA: Diagnosis not present

## 2023-07-18 NOTE — Progress Notes (Signed)
Office Visit    Patient Name: Stacy Roberts Date of Encounter: 07/18/2023  Primary Care Provider:  Avel Sensor, MD Primary Cardiologist:  Reatha Harps, MD  Chief Complaint    52 year old female with a history of palpitations, and hyperlipidemia who presents for follow-up related to palpitations.  Past Medical History    Past Medical History:  Diagnosis Date   Allergy    Cancer (HCC)    Basal cell, underarm and behind ear   Fibroid    Rectal bleeding    Past Surgical History:  Procedure Laterality Date   COLONOSCOPY     DENTAL SURGERY     DILATION AND CURETTAGE OF UTERUS     x2   MOUTH SURGERY  1990    Allergies  Allergies  Allergen Reactions   Bee Venom Hives, Itching, Rash and Swelling     Labs/Other Studies Reviewed    The following studies were reviewed today:  Cardiac Studies & Procedures         MONITORS  LONG TERM MONITOR (3-14 DAYS) 08/31/2021  Narrative Enrollment 08/17/2021-08/25/2021 (7 days 0 hours). Patient had a min HR of 48 bpm (sinus bradycardia), max HR of 167 bpm (sinus tachycardia), and avg HR of 73 bpm (normal sinus rhythm). Predominant underlying rhythm was Sinus Rhythm. 1 run of non-sustained Ventricular Tachycardia occurred lasting 4 beats (1.7 second duration) with a max rate of 156 bpm (avg 150 bpm). Isolated SVEs were rare (<1.0%), SVE Couplets were rare (<1.0%), and SVE Triplets were rare (<1.0%). Isolated VEs were rare (<1.0%), VE Couplets were rare (<1.0%), and no VE Triplets were present. Ventricular Bigeminy was present.  08/18/21 01:19pm Chest pain or pressure coincided with normal sinus rhythm 65 bpm. 08/20/21 01:10am Chest pain or pressure coincided with normal sinus rhythm 69 bpm. 08/20/21 05:30pm Chest pain or pressure coincided with normal sinus rhythm 72 bpm. 08/21/21 01:16am Fluttering or racing coincided with normal sinus rhythm 88 bpm. 08/23/21 01:41pm Chest pain or pressure coincided with normal sinus rhythm  74 bpm. 08/23/21 07:43pm Chest pain or pressure coincided with normal sinus rhythm 67 bpm.  Impression: 1. No significant arrhythmias detected. 2. Rare ectopy. 3. Symptoms occurred with normal sinus rhythm.  Gerri Spore T. Flora Lipps, MD, [facility]  [facility] [address], [address] [place], [place] [address] [phone] 4:24 PM   CT SCANS  CT CARDIAC SCORING (SELF PAY ONLY) 09/14/2021  Addendum 09/14/2021  4:47 PM ADDENDUM REPORT: 09/14/2021 16:44  ADDENDUM: Cardiovascular Disease Risk stratification  EXAM: Coronary Calcium Score  TECHNIQUE: A gated, non-contrast computed tomography scan of the heart was performed using 3mm slice thickness. Axial images were analyzed on a dedicated workstation. Calcium scoring of the coronary arteries was performed using the Agatston method.  FINDINGS: Coronary arteries: Normal origins.  Coronary Calcium Score:  Left main: 0  Left anterior descending artery: 0  Left circumflex artery: 0  Right coronary artery: 0  Total: 0  Percentile: 0  Pericardium: Normal.  Ascending Aorta: Normal caliber.  Non-cardiac: See separate report from [facility].  IMPRESSION: Coronary calcium score of 0. This was 0 percentile for age-, race-, and sex-matched controls.  RECOMMENDATIONS: Coronary artery calcium (CAC) score is a strong predictor of incident coronary heart disease (CHD) and provides predictive information beyond traditional risk factors. CAC scoring is reasonable to use in the decision to withhold, postpone, or initiate statin therapy in intermediate-risk or selected borderline-risk asymptomatic adults (age 33-75 years and LDL-C >=70 to <190 mg/dL) who do  not have diabetes or established atherosclerotic cardiovascular disease (ASCVD).* In intermediate-risk (10-year ASCVD risk >=7.5% to <20%) adults or selected borderline-risk (10-year ASCVD risk >=5% to <7.5%) adults in whom a CAC score is measured  for the purpose of making a treatment decision the following recommendations have been made:  If CAC=0, it is reasonable to withhold statin therapy and reassess in 5 to 10 years, as long as higher risk conditions are absent (diabetes mellitus, family history of premature CHD in first degree relatives (males <55 years; females <65 years), cigarette smoking, or LDL >=190 mg/dL).  If CAC is 1 to 99, it is reasonable to initiate statin therapy for patients >=64 years of age.  If CAC is >=100 or >=75th percentile, it is reasonable to initiate statin therapy at any age.  Cardiology referral should be considered for patients with CAC scores >=400 or >=75th percentile.  *2018 AHA/ACC/AACVPR/AAPA/ABC/ACPM/ADA/AGS/APhA/ASPC/NLA/PCNA Guideline on the Management of Blood Cholesterol: A Report of the American College of Cardiology/American Heart Association Task Force on Clinical Practice Guidelines. J Am Coll Cardiol. 2019;73(24):3168-3209.  Thomasene Ripple, DO [facility]   Electronically Signed By: Thomasene Ripple D.O. On: 09/14/2021 16:44  Narrative EXAM: OVER-READ INTERPRETATION  CT CHEST  The following report is an over-read performed by radiologist Dr. Charlett Nose of [facility] on 09/14/2021. This over-read does not include interpretation of cardiac or coronary anatomy or pathology. The coronary calcium score interpretation by the cardiologist is attached.  COMPARISON:  None.  FINDINGS: Vascular: Heart is normal size.  Aorta normal caliber.  Mediastinum/Nodes: No adenopathy  Lungs/Pleura: No confluent opacities or effusions.  Upper Abdomen: No acute findings  Musculoskeletal: Chest wall soft tissues are unremarkable. No acute bony abnormality.  IMPRESSION: No acute or significant extracardiac abnormality.  Electronically Signed: By: Charlett Nose M.D. On: 09/14/2021 11:06         Recent Labs: No results found for requested labs within last 365 days.  Recent  Lipid Panel    Component Value Date/Time   CHOL 176 09/22/2006 1133   TRIG 72 09/22/2006 1133   HDL 54.3 09/22/2006 1133   CHOLHDL 3.2 CALC 09/22/2006 1133   VLDL 14 09/22/2006 1133   LDLCALC 107 (H) 09/22/2006 1133    History of Present Illness    52 year old female with the above past medical history including palpitations, and hyperlipidemia.   She has a history of palpitations.  Cardiac monitor in January 2023 revealed sinus rhythm, rare ectopy, no significant arrhythmia.  Symptoms occurred with normal sinus rhythm.  CT calcium scoring in 08/2021 revealed coronary calcium score of 0.  She was last seen in the office on 09/07/2021 and was stable from a cardiac standpoint.  Overall, her symptoms of palpitations had resolved.  She presents today for follow-up.  Since her last visit she has been stable overall from a cardiac standpoint.  In the past 3 months she has had 2 episodes elevated heart rate, HR greater than 150 bpm, that woke her from her sleep, with associated tightness in her neck.  Her symptoms resolved within 10 minutes.  Her last episode was in September 2024.  She denies any palpitations since.  She denies any chest pain, easiness, presyncope, syncope, dyspnea, edema, PND, orthopnea, weight gain.  Otherwise, she reports feeling well.  Home Medications    Current Outpatient Medications  Medication Sig Dispense Refill   Cholecalciferol (VITAMIN D3 PO) Take 1 capsule by mouth daily. Takes     Cyanocobalamin 1000 MCG TBCR Take by  mouth.     Lactobacillus Rhamnosus, GG, (CULTURELLE) CAPS Take by mouth. Probiotic daily     Multiple Vitamins-Minerals (MULTIVITAMIN ADULT PO) Take by mouth.     Turmeric Curcumin 500 MG CAPS Take by mouth.     famotidine (PEPCID) 40 MG tablet Take 40 mg by mouth daily. (Patient not taking: Reported on 07/18/2023)     hydrOXYzine (ATARAX) 25 MG tablet Take 25 mg by mouth 3 (three) times daily as needed. (Patient not taking: Reported on  07/18/2023)     nitroGLYCERIN (NITRODUR - DOSED IN MG/24 HR) 0.2 mg/hr patch Apply 1/4th patch to affected achilles, change daily.  After 1 week can apply to both achilles tendons, change daily (Patient not taking: Reported on 07/18/2023) 30 patch 1   No current facility-administered medications for this visit.     Review of Systems    She denies chest pain, dyspnea, pnd, orthopnea, n, v, dizziness, syncope, edema, weight gain, or early satiety. All other systems reviewed and are otherwise negative except as noted above.   Physical Exam    VS:  BP 108/72 (BP Location: Right Arm, Patient Position: Sitting, Cuff Size: Normal)   Pulse 68   Ht 5\' 7"  (1.702 m)   Wt 126 lb (57.2 kg)   HC 16" (40.6 cm)   SpO2 99%   BMI 19.73 kg/m   GEN: Well nourished, well developed, in no acute distress. HEENT: normal. Neck: Supple, no JVD, carotid bruits, or masses. Cardiac: RRR, no murmurs, rubs, or gallops. No clubbing, cyanosis, edema.  Radials/DP/PT 2+ and equal bilaterally.  Respiratory:  Respirations regular and unlabored, clear to auscultation bilaterally. GI: Soft, nontender, nondistended, BS + x 4. MS: no deformity or atrophy. Skin: warm and dry, no rash. Neuro:  Strength and sensation are intact. Psych: Normal affect.  Accessory Clinical Findings    ECG personally reviewed by me today - EKG Interpretation Date/Time:  Tuesday July 18 2023 13:49:09 EST Ventricular Rate:  68 PR Interval:  148 QRS Duration:  96 QT Interval:  416 QTC Calculation: 442 R Axis:   55  Text Interpretation: Normal sinus rhythm Normal ECG No previous ECGs available Confirmed by Bernadene Person ([id number]) on 07/18/2023 1:54:37 PM  - no acute changes.   Lab Results  Component Value Date   WBC 4.6 09/22/2006   HGB 13.9 09/22/2006   HCT 40.1 09/22/2006   MCV 91.6 09/22/2006   PLT 164 09/22/2006   Lab Results  Component Value Date   CREATININE 0.9 09/22/2006   BUN 13 09/22/2006   NA 142 09/22/2006   K 4.4  09/22/2006   CL 106 09/22/2006   CO2 26 09/22/2006   Lab Results  Component Value Date   ALT 19 09/22/2006   AST 23 09/22/2006   ALKPHOS 43 09/22/2006   BILITOT 1.0 09/22/2006   Lab Results  Component Value Date   CHOL 176 09/22/2006   HDL 54.3 09/22/2006   LDLCALC 107 (H) 09/22/2006   TRIG 72 09/22/2006   CHOLHDL 3.2 CALC 09/22/2006    No results found for: "HGBA1C"  Assessment & Plan   1. Palpitations: Cardiac monitor in January 2023 revealed sinus rhythm, rare ectopy, no significant arrhythmia. Symptoms occurred with normal sinus rhythm.  She has had 2 episodes in the past 3 months of elevated heart rate, HR greater than 150 bpm, associated tightness in her neck.  EKG reading on Apple Watch showed possible SVT, atrial tachycardia, difficult to interpret due to artifact. Symptoms lasted for less  than 10 minutes and resolved spontaneously.  She denies any palpitations since.  Will check echo.  If symptoms become more frequent and persistent, consider cardiac monitor.  For now, she will continue home monitoring with Apple Watch/Kardia mobile.  Reviewed ED precautions.  2. Hyperlipidemia: LDL was 135 in 01/2023. CT calcium scoring in 08/2021 revealed coronary calcium score of 0. Not on statin therapy.   3. Disposition: Follow-up in 4-6 months, sooner if needed.       Joylene Grapes, NP 07/18/2023, 2:21 PM

## 2023-07-18 NOTE — Patient Instructions (Signed)
Medication Instructions:  Your physician recommends that you continue on your current medications as directed. Please refer to the Current Medication list given to you today.  *If you need a refill on your cardiac medications before your next appointment, please call your pharmacy*   Lab Work: NONE ordered at this time of appointment     Testing/Procedures: Your physician has requested that you have an echocardiogram. Echocardiography is a painless test that uses sound waves to create images of your heart. It provides your doctor with information about the size and shape of your heart and how well your heart's chambers and valves are working. This procedure takes approximately one hour. There are no restrictions for this procedure. Please do NOT wear cologne, perfume, aftershave, or lotions (deodorant is allowed). Please arrive 15 minutes prior to your appointment time.  Please note: We ask at that you not bring children with you during ultrasound (echo/ vascular) testing. Due to room size and safety concerns, children are not allowed in the ultrasound rooms during exams. Our front office staff cannot provide observation of children in our lobby area while testing is being conducted. An adult accompanying a patient to their appointment will only be allowed in the ultrasound room at the discretion of the ultrasound technician under special circumstances. We apologize for any inconvenience.    Follow-Up: At [facility], you and your health needs are our priority.  As part of our continuing mission to provide you with exceptional heart care, we have created designated Provider Care Teams.  These Care Teams include your primary Cardiologist (physician) and Advanced Practice Providers (APPs -  Physician Assistants and Nurse Practitioners) who all work together to provide you with the care you need, when you need it.  We recommend signing up for the patient portal called "MyChart".  Sign  up information is provided on this After Visit Summary.  MyChart is used to connect with patients for Virtual Visits (Telemedicine).  Patients are able to view lab/test results, encounter notes, upcoming appointments, etc.  Non-urgent messages can be sent to your provider as well.   To learn more about what you can do with MyChart, go to [email or web address].    Your next appointment:   4-6 month(s)  Provider:   Reatha Harps, MD     Other Instructions

## 2023-08-24 ENCOUNTER — Other Ambulatory Visit: Payer: Self-pay | Admitting: Nurse Practitioner

## 2023-08-24 DIAGNOSIS — I471 Supraventricular tachycardia, unspecified: Secondary | ICD-10-CM

## 2023-08-24 DIAGNOSIS — R002 Palpitations: Secondary | ICD-10-CM

## 2023-08-24 DIAGNOSIS — E782 Mixed hyperlipidemia: Secondary | ICD-10-CM

## 2023-08-28 ENCOUNTER — Ambulatory Visit (HOSPITAL_COMMUNITY)
Admission: RE | Admit: 2023-08-28 | Discharge: 2023-08-28 | Disposition: A | Discharge status: Home / Self Care | Payer: Managed Care, Other (non HMO) | Source: Ambulatory Visit | Attending: Cardiology | Admitting: Cardiology

## 2023-08-28 DIAGNOSIS — R002 Palpitations: Secondary | ICD-10-CM | POA: Diagnosis not present

## 2023-08-28 DIAGNOSIS — I361 Nonrheumatic tricuspid (valve) insufficiency: Secondary | ICD-10-CM

## 2023-08-28 DIAGNOSIS — I471 Supraventricular tachycardia, unspecified: Secondary | ICD-10-CM | POA: Diagnosis not present

## 2023-08-28 LAB — ECHOCARDIOGRAM COMPLETE
AR max vel: 1.71 cm2
AV Area VTI: 1.73 cm2
AV Area mean vel: 1.7 cm2
AV Mean grad: 3 mm[Hg]
AV Peak grad: 5.7 mm[Hg]
Ao pk vel: 1.19 m/s
Area-P 1/2: 3.76 cm2
MV M vel: 3.36 m/s
MV Peak grad: 45.2 mm[Hg]
S' Lateral: 2.92 cm

## 2023-09-05 ENCOUNTER — Telehealth: Payer: Self-pay

## 2023-09-05 NOTE — Telephone Encounter (Signed)
 Echo results reviewed by pt via mychart.
